# Patient Record
Sex: Female | Born: 1965 | Race: Black or African American | Hispanic: No | Marital: Married | Smoking: Never smoker
Health system: Southern US, Community
[De-identification: ages and names within clinical notes are randomized; demographics above are authoritative.]

## PROBLEM LIST (undated history)

## (undated) DIAGNOSIS — I10 Essential (primary) hypertension: Secondary | ICD-10-CM

## (undated) DIAGNOSIS — R7303 Prediabetes: Secondary | ICD-10-CM

## (undated) DIAGNOSIS — D649 Anemia, unspecified: Secondary | ICD-10-CM

## (undated) HISTORY — DX: Essential (primary) hypertension: I10

## (undated) HISTORY — DX: Anemia, unspecified: D64.9

---
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
## 1996-07-11 HISTORY — PX: CHOLECYSTECTOMY: SHX55

## 1997-07-11 HISTORY — PX: UMBILICAL HERNIA REPAIR: SHX196

## 2003-06-19 ENCOUNTER — Ambulatory Visit (HOSPITAL_COMMUNITY): Admission: RE | Admit: 2003-06-19 | Discharge: 2003-06-19 | Payer: Self-pay | Admitting: *Deleted

## 2003-08-22 ENCOUNTER — Ambulatory Visit (HOSPITAL_COMMUNITY): Admission: RE | Admit: 2003-08-22 | Discharge: 2003-08-22 | Payer: Self-pay | Admitting: *Deleted

## 2003-10-14 ENCOUNTER — Ambulatory Visit (HOSPITAL_COMMUNITY): Admission: RE | Admit: 2003-10-14 | Discharge: 2003-10-14 | Payer: Self-pay | Admitting: *Deleted

## 2003-10-21 ENCOUNTER — Ambulatory Visit (HOSPITAL_COMMUNITY): Admission: RE | Admit: 2003-10-21 | Discharge: 2003-10-21 | Payer: Self-pay | Admitting: *Deleted

## 2003-11-14 ENCOUNTER — Inpatient Hospital Stay (HOSPITAL_COMMUNITY): Admission: AD | Admit: 2003-11-14 | Discharge: 2003-11-14 | Payer: Self-pay | Admitting: Family Medicine

## 2003-11-18 ENCOUNTER — Inpatient Hospital Stay (HOSPITAL_COMMUNITY): Admission: RE | Admit: 2003-11-18 | Discharge: 2003-11-21 | Payer: Self-pay | Admitting: *Deleted

## 2003-11-24 ENCOUNTER — Inpatient Hospital Stay (HOSPITAL_COMMUNITY): Admission: AD | Admit: 2003-11-24 | Discharge: 2003-11-24 | Payer: Self-pay | Admitting: Obstetrics and Gynecology

## 2004-09-30 ENCOUNTER — Other Ambulatory Visit: Admission: RE | Admit: 2004-09-30 | Discharge: 2004-09-30 | Payer: Self-pay | Admitting: Obstetrics and Gynecology

## 2005-03-25 IMAGING — US US FETAL BPP W/O NONSTRESS
1 series · 14 of 28 positions shown · non-contrast
Comparison: none

CLINICAL DATA: Assess amniotic fluid volume and biophysical profile.  G2 P1.

[Series 1: unknown · 14 of 41 slices shown]
[im 2/41]
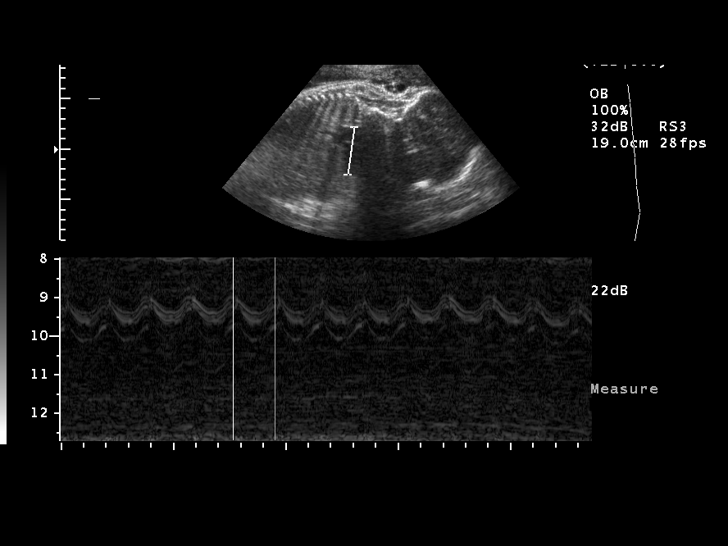
[im 5/41]
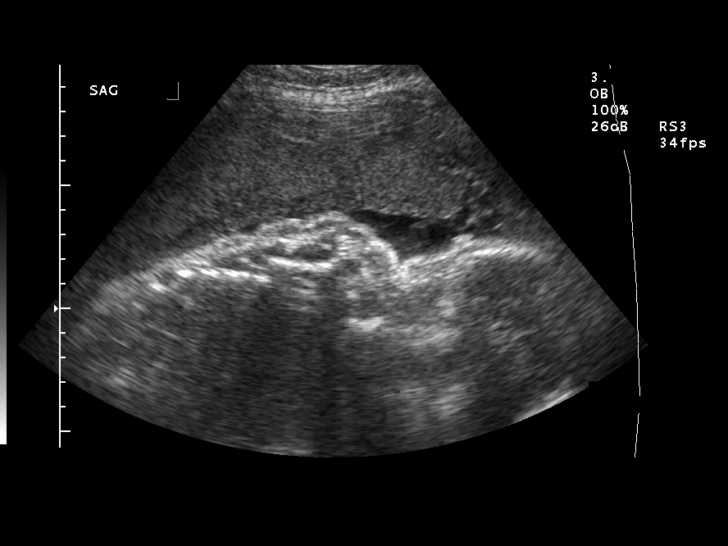
[im 8/41]
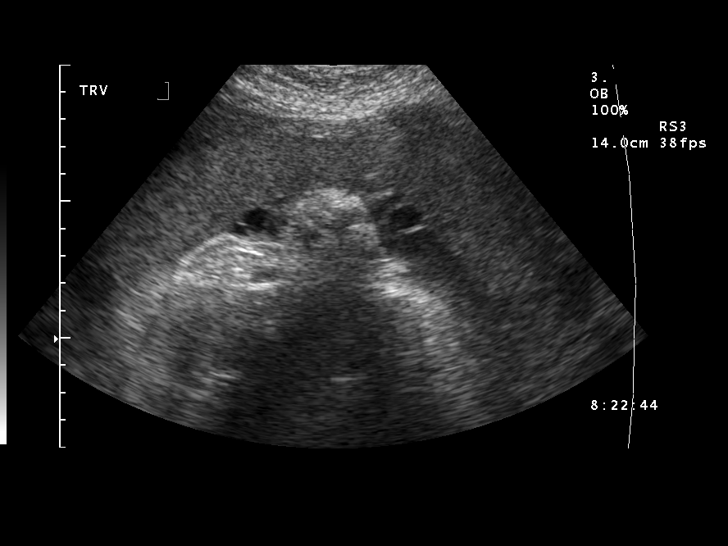
[im 11/41]
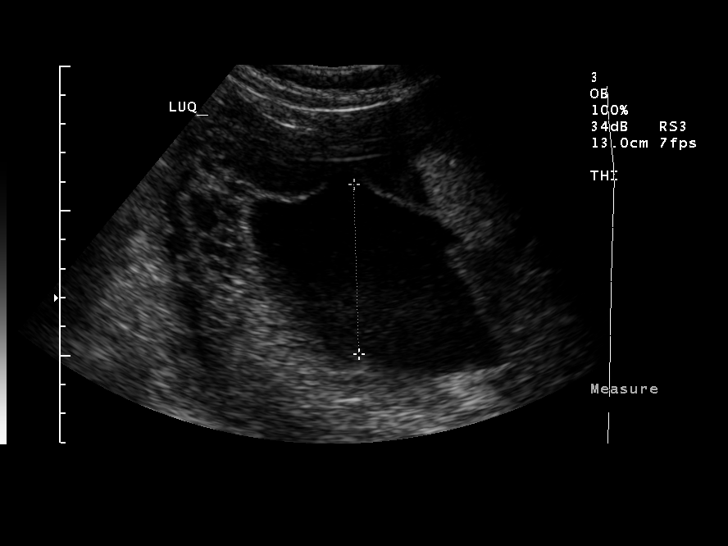
[im 14/41]
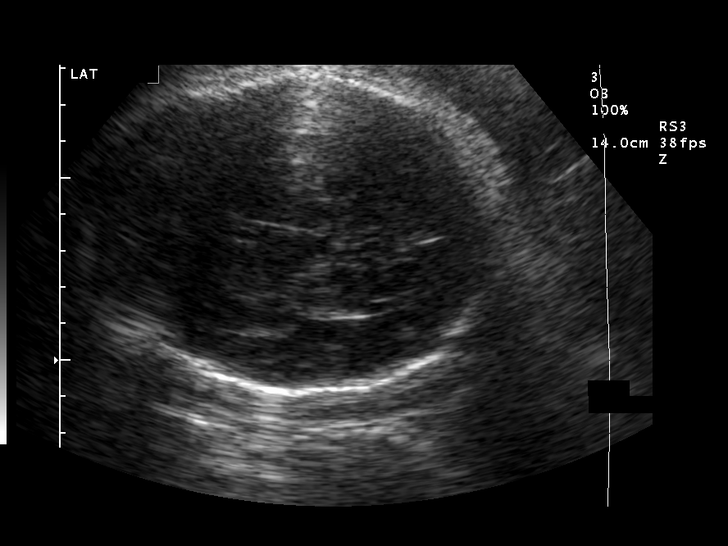
[im 17/41]
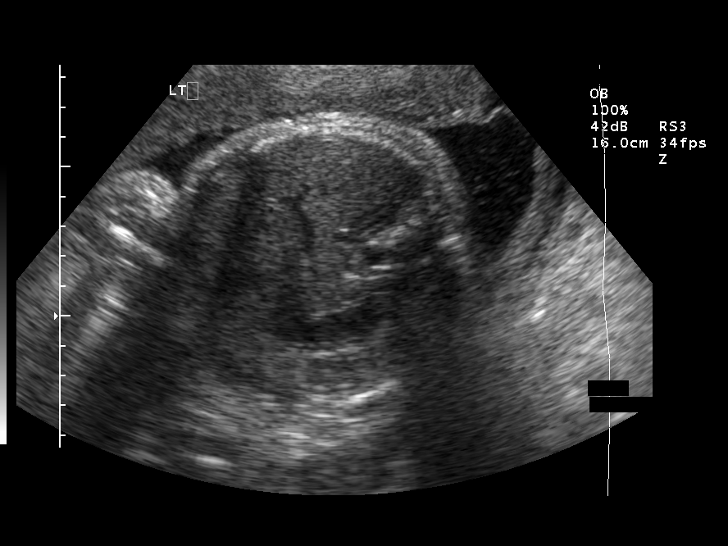
[im 20/41]
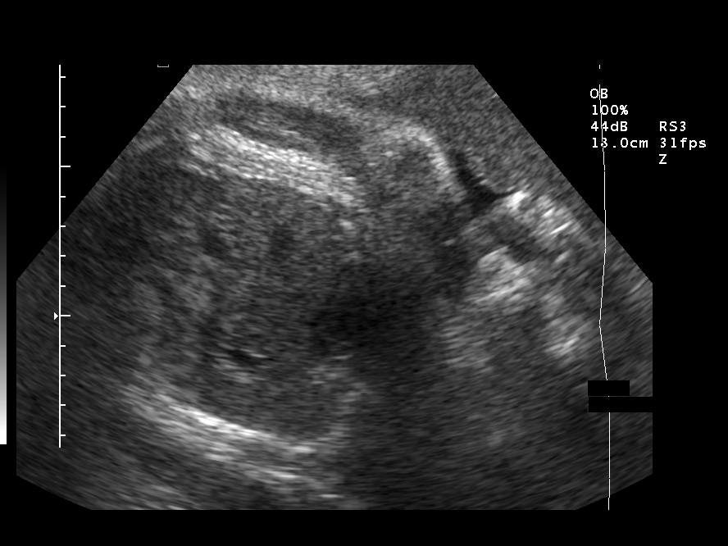
[im 23/41]
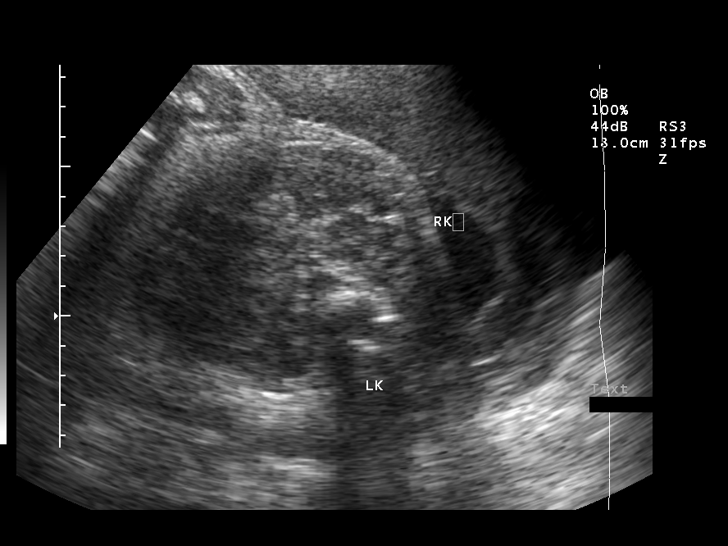
[im 26/41]
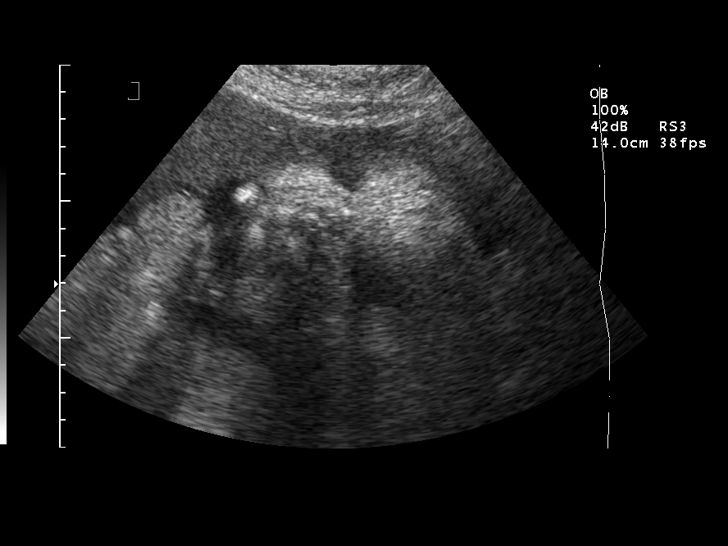
[im 29/41]
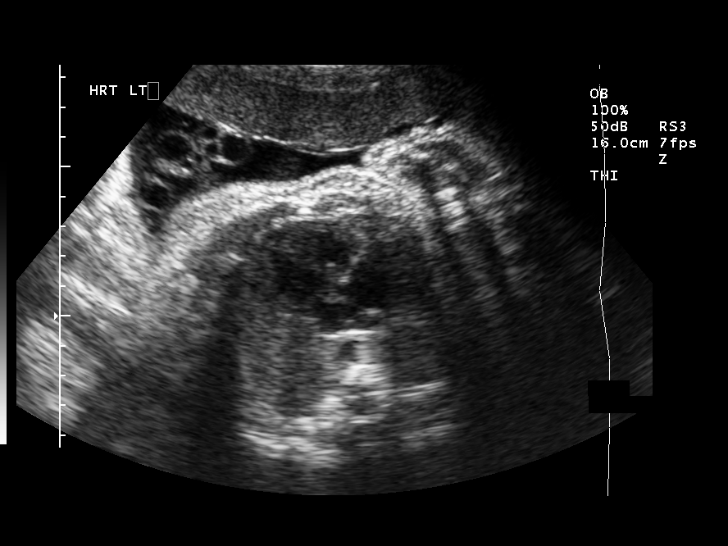
[im 32/41]
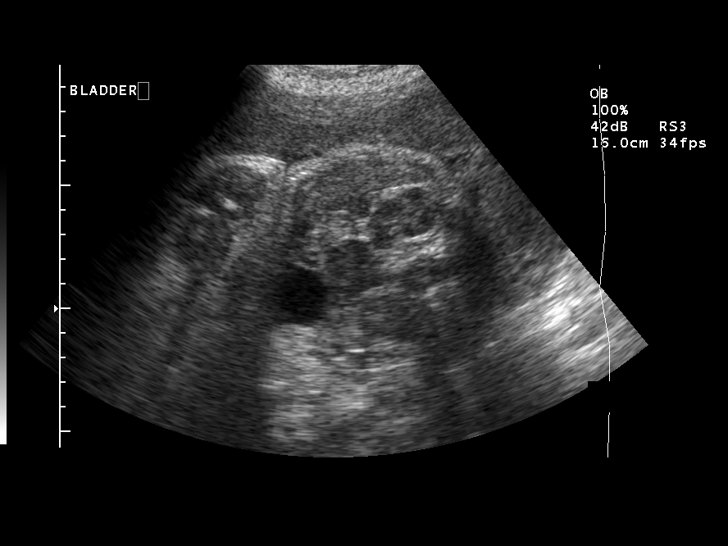
[im 35/41]
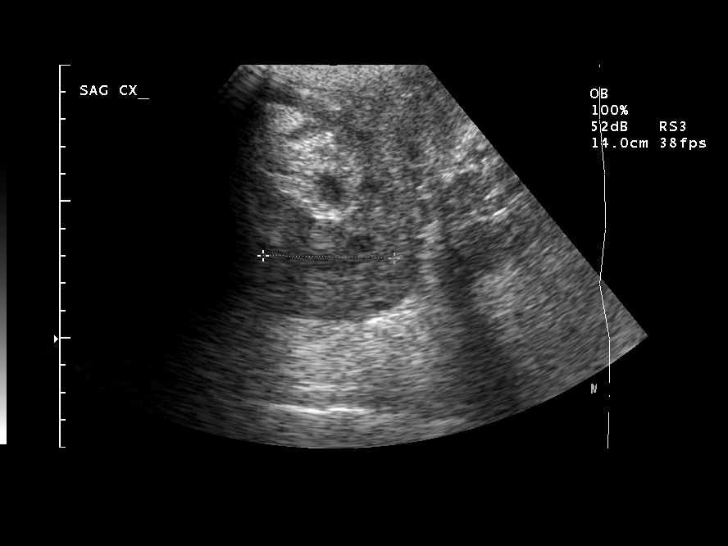
[im 38/41]
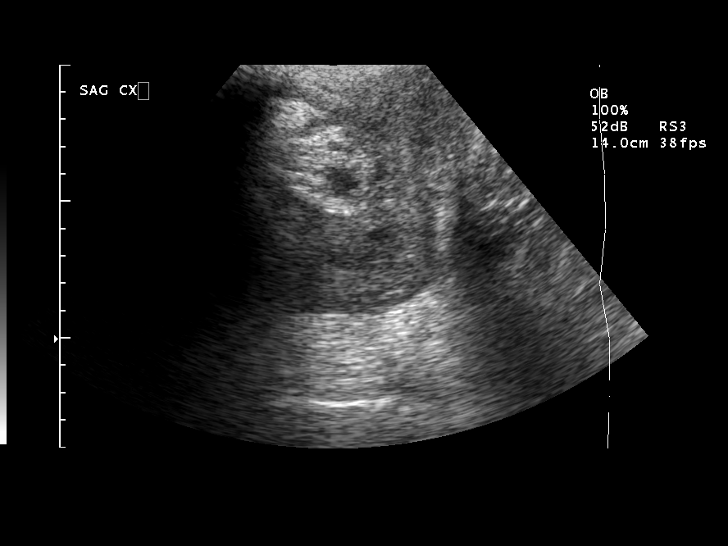
[im 41/41]
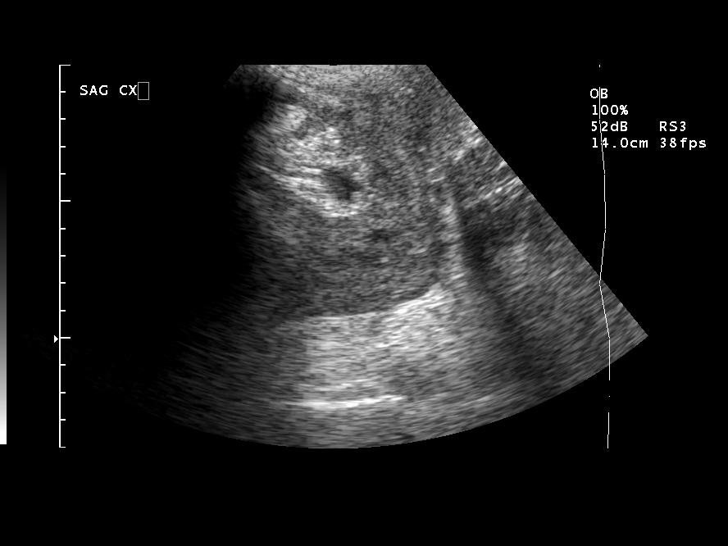

[14 of 28 positions shown; findings below may reference images not displayed]

LIMITED OBSTETRICAL ULTRASOUND
 Number of Fetuses:  1
 Heart Rate:  162
 Movement:  Yes
 Breathing:  Yes
 Presentation:  Cephalic
 Placental Location:  Anterior
 Grade:  II
 Previa:  No
 Amniotic Fluid (Subjective):  Normal
 Amniotic Fluid (Objective):  17.3 cm AFI (5th -95th%ile =   7.7 ? 24.9 cm for 36 wks)

 Fetal measurements and complete anatomic evaluation were not requested.  The following fetal anatomy was visualized during this exam:  Lateral ventricles, stomach, kidneys, bladder and diaphragm

 MATERNAL FINDINGS
 Cervix:  4.6 cm Translabially
IMPRESSION: Single living intrauterine fetus in cephalic presentation.  Normal amniotic fluid volume with AFI 17.3 cm.
 BIOPHYSICAL PROFILE

 Movement:  2    Time:  30 minutes
 Breathing:  2
 Tone:  2
 Amniotic Fluid:  2

 Total Score:  8
IMPRESSION: Biophysical profile score is [DATE] in 30 minutes of scanning.

## 2006-03-24 ENCOUNTER — Inpatient Hospital Stay (HOSPITAL_COMMUNITY): Admission: RE | Admit: 2006-03-24 | Discharge: 2006-03-27 | Payer: Self-pay | Admitting: Obstetrics

## 2006-03-24 ENCOUNTER — Encounter (INDEPENDENT_AMBULATORY_CARE_PROVIDER_SITE_OTHER): Payer: Self-pay | Admitting: Specialist

## 2006-04-07 ENCOUNTER — Inpatient Hospital Stay (HOSPITAL_COMMUNITY): Admission: AD | Admit: 2006-04-07 | Discharge: 2006-04-07 | Payer: Self-pay | Admitting: Obstetrics

## 2006-11-28 ENCOUNTER — Emergency Department (HOSPITAL_COMMUNITY): Admission: EM | Admit: 2006-11-28 | Discharge: 2006-11-29 | Payer: Self-pay | Admitting: Emergency Medicine

## 2008-07-21 ENCOUNTER — Ambulatory Visit (HOSPITAL_COMMUNITY): Admission: RE | Admit: 2008-07-21 | Discharge: 2008-07-21 | Payer: Self-pay | Admitting: Obstetrics

## 2010-11-26 NOTE — Op Note (Signed)
[name], [name]                            ACCOUNT NO.:  [id number]   MEDICAL RECORD NO.:  [id number]                   PATIENT TYPE:  INP   LOCATION:  9119                                 FACILITY:  [facility]   PHYSICIAN:  Conni Elliot, M.D.             DATE OF BIRTH:  1965/11/14   DATE OF PROCEDURE:  11/18/2003  DATE OF DISCHARGE:                                 OPERATIVE REPORT   PREOPERATIVE DIAGNOSES:  Desire for repeat cesarean delivery.   POSTOPERATIVE DIAGNOSES:  Desire for repeat cesarean delivery.   OPERATION:  Low transverse cesarean section.   SURGEON:  Conni Elliot, M.D.   FINDINGS:  Female infant with Apgars of 9 and 9, cord pH was sent, placenta  was not sent.   DESCRIPTION OF PROCEDURE:  After placing the patient under spinal  anesthetic, the patient supine, __________  abdomen was prepped and draped  in a sterile fashion.  Low transverse Pfannenstiel incision was made.  Incision was made in the skin and fascia, rectus muscles opened in line.  Peritoneal cavity, bladder flap, uterine incision was made.  The infant was  in vertex presentation, cord double clamped and cut, infant handed off to  pediatrician in attendance.  Was a loose nuchal cord.  The placenta was  delivered spontaneously, uterus, bladder flap, anterior peritoneum, fascia  and subcutaneous tissue were closed in usual fashion. Estimated blood loss  approximately [phone]  mL without replacement.                                               Conni Elliot, M.D.    ASG/MEDQ  D:  11/18/2003  T:  11/19/2003  Job:  [id number]

## 2010-11-26 NOTE — Discharge Summary (Signed)
Carla [name], Carla [name]                            ACCOUNT NO.:  [id number]   MEDICAL RECORD NO.:  [id number]                   PATIENT TYPE:  INP   LOCATION:  9119                                 FACILITY:  [facility]   PHYSICIAN:  Conni Elliot, M.D.             DATE OF BIRTH:  [birth year]   DATE OF ADMISSION:  11/18/2003  DATE OF DISCHARGE:  11/21/2003                                 DISCHARGE SUMMARY   HISTORY OF PRESENT ILLNESS:  The patient is a 45 year old patient with prior  cesarean delivery and requesting a repeat.  The patient is at term.  The  patient was admitted for repeat low transverse cesarean section.   HOSPITAL COURSE:  The patient had a female infant with Apgars of 9 and 9,  weighed 8 pounds 1 ounce. The patient had a normal postoperative course and  had regained normal bowel and bladder function and was thought to be ready  for discharge on Nov 21, 2003.                                               Conni Elliot, M.D.    ASG/MEDQ  D:  03/16/2004  T:  03/16/2004  Job:  [id number]

## 2010-11-26 NOTE — Discharge Summary (Signed)
NAMECHRYSA, RAMPY                ACCOUNT NO.:  [id number]   MEDICAL RECORD NO.:  [id number]          PATIENT TYPE:  INP   LOCATION:  9117                          FACILITY:  [facility]   PHYSICIAN:  Kathreen Cosier, M.D.DATE OF BIRTH:  07/29/1965   DATE OF ADMISSION:  03/24/2006  DATE OF DISCHARGE:  03/27/2006                                 DISCHARGE SUMMARY   The patient is a 45 year old gravida 3, para 2-0-02, estimated date of  confinement April 01, 2006.  She had two previous cesarean sections and  she __________ for repeat cesarean section and tubal ligation.  She has  positive GBS.  The patient underwent  a repeat low transverse cesarean  section and tubal ligation.  She had a 9 pound 5 ounce female Apgar 9/9 from  the OP position.  Postoperatively she did well.  On admission her hemoglobin  was 10.5, postoperative 8.3, platelet count 155 and 141.  PT, PTT, INR  normal.  Sodium 134, potassium 3.7, chloride 104.  Remainder of labs on the  prenatal record.  Patient was discharged on the third postoperative day on  Tylox for pain and ferrous sulfate for anemia.           ______________________________  Kathreen Cosier, M.D.     BAM/MEDQ  D:  04/26/2006  T:  04/27/2006  Job:  [id number]

## 2011-03-04 ENCOUNTER — Other Ambulatory Visit (HOSPITAL_COMMUNITY): Payer: Self-pay | Admitting: Obstetrics

## 2011-03-04 DIAGNOSIS — Z1231 Encounter for screening mammogram for malignant neoplasm of breast: Secondary | ICD-10-CM

## 2011-03-23 ENCOUNTER — Ambulatory Visit (HOSPITAL_COMMUNITY)
Admission: RE | Admit: 2011-03-23 | Discharge: 2011-03-23 | Disposition: A | Discharge status: Home / Self Care | Payer: BC Managed Care – PPO | Source: Ambulatory Visit | Attending: Obstetrics | Admitting: Obstetrics

## 2011-03-23 DIAGNOSIS — Z1231 Encounter for screening mammogram for malignant neoplasm of breast: Secondary | ICD-10-CM

## 2011-03-28 ENCOUNTER — Other Ambulatory Visit: Payer: Self-pay | Admitting: Obstetrics

## 2011-03-28 DIAGNOSIS — R928 Other abnormal and inconclusive findings on diagnostic imaging of breast: Secondary | ICD-10-CM

## 2011-04-06 ENCOUNTER — Ambulatory Visit
Admission: RE | Admit: 2011-04-06 | Discharge: 2011-04-06 | Disposition: A | Discharge status: Home / Self Care | Payer: BC Managed Care – PPO | Source: Ambulatory Visit | Attending: Obstetrics | Admitting: Obstetrics

## 2011-04-06 DIAGNOSIS — R928 Other abnormal and inconclusive findings on diagnostic imaging of breast: Secondary | ICD-10-CM

## 2012-03-19 ENCOUNTER — Other Ambulatory Visit (HOSPITAL_COMMUNITY): Payer: Self-pay | Admitting: Family Medicine

## 2012-03-19 DIAGNOSIS — Z1231 Encounter for screening mammogram for malignant neoplasm of breast: Secondary | ICD-10-CM

## 2012-04-02 ENCOUNTER — Ambulatory Visit (HOSPITAL_COMMUNITY)
Admission: RE | Admit: 2012-04-02 | Discharge: 2012-04-02 | Disposition: A | Discharge status: Home / Self Care | Payer: BC Managed Care – PPO | Source: Ambulatory Visit | Attending: Family Medicine | Admitting: Family Medicine

## 2012-04-02 DIAGNOSIS — Z1231 Encounter for screening mammogram for malignant neoplasm of breast: Secondary | ICD-10-CM

## 2013-02-28 ENCOUNTER — Other Ambulatory Visit (HOSPITAL_COMMUNITY): Payer: Self-pay | Admitting: Obstetrics

## 2013-02-28 DIAGNOSIS — Z1231 Encounter for screening mammogram for malignant neoplasm of breast: Secondary | ICD-10-CM

## 2013-04-03 ENCOUNTER — Ambulatory Visit (HOSPITAL_COMMUNITY)
Admission: RE | Admit: 2013-04-03 | Discharge: 2013-04-03 | Disposition: A | Discharge status: Home / Self Care | Payer: BC Managed Care – PPO | Source: Ambulatory Visit | Attending: Obstetrics | Admitting: Obstetrics

## 2013-04-03 DIAGNOSIS — Z1231 Encounter for screening mammogram for malignant neoplasm of breast: Secondary | ICD-10-CM | POA: Insufficient documentation

## 2013-08-28 ENCOUNTER — Ambulatory Visit (INDEPENDENT_AMBULATORY_CARE_PROVIDER_SITE_OTHER): Payer: BC Managed Care – PPO | Admitting: Family Medicine

## 2013-08-28 ENCOUNTER — Ambulatory Visit: Payer: BC Managed Care – PPO

## 2013-08-28 VITALS — BP 136/90 | HR 66 | Temp 98.6°F | Ht 62.0 in | Wt 161.4 lb

## 2013-08-28 DIAGNOSIS — I10 Essential (primary) hypertension: Secondary | ICD-10-CM

## 2013-08-28 DIAGNOSIS — M25511 Pain in right shoulder: Secondary | ICD-10-CM

## 2013-08-28 DIAGNOSIS — M25519 Pain in unspecified shoulder: Secondary | ICD-10-CM

## 2013-08-28 MED ORDER — MELOXICAM 15 MG PO TABS: ORAL_TABLET | ORAL | Status: DC

## 2013-08-28 NOTE — Patient Instructions (Addendum)
Thank you for coming in today  Xrays are negative for fracture Wear sling for the next week Return in 1 week next Wednesday night after 5pm to see Dr. Neomia DearVoss for recheck Take meloxicam daily with breakfast for the next week No use of right arm at work  Shoulder Pain The shoulder is the joint that connects your arms to your body. The bones that form the shoulder joint include the upper arm bone (humerus), the shoulder blade (scapula), and the collarbone (clavicle). The top of the humerus is shaped like a ball and fits into a rather flat socket on the scapula (glenoid cavity). A combination of muscles and strong, fibrous tissues that connect muscles to bones (tendons) support your shoulder joint and hold the ball in the socket. Small, fluid-filled sacs (bursae) are located in different areas of the joint. They act as cushions between the bones and the overlying soft tissues and help reduce friction between the gliding tendons and the bone as you move your arm. Your shoulder joint allows a wide range of motion in your arm. This range of motion allows you to do things like scratch your back or throw a ball. However, this range of motion also makes your shoulder more prone to pain from overuse and injury. Causes of shoulder pain can originate from both injury and overuse and usually can be grouped in the following four categories:  Redness, swelling, and pain (inflammation) of the tendon (tendinitis) or the bursae (bursitis).  Instability, such as a dislocation of the joint.  Inflammation of the joint (arthritis).  Broken bone (fracture). HOME CARE INSTRUCTIONS   Apply ice to the sore area.  Put ice in a plastic bag.  Place a towel between your skin and the bag.  Leave the ice on for 15-20 minutes, 03-04 times per day for the first 2 days.  Stop using cold packs if they do not help with the pain.  If you have a shoulder sling or immobilizer, wear it as long as your caregiver instructs. Only  remove it to shower or bathe. Move your arm as little as possible, but keep your hand moving to prevent swelling.  Squeeze a soft ball or foam pad as much as possible to help prevent swelling.  Only take over-the-counter or prescription medicines for pain, discomfort, or fever as directed by your caregiver. SEEK MEDICAL CARE IF:   Your shoulder pain increases, or new pain develops in your arm, hand, or fingers.  Your hand or fingers become cold and numb.  Your pain is not relieved with medicines. SEEK IMMEDIATE MEDICAL CARE IF:   Your arm, hand, or fingers are numb or tingling.  Your arm, hand, or fingers are significantly swollen or turn white or blue. MAKE SURE YOU:   Understand these instructions.  Will watch your condition.  Will get help right away if you are not doing well or get worse. Document Released: 04/06/2005 Document Revised: 03/21/2012 Document Reviewed: 06/11/2011 [facility]Exit[facility] Patient Information 2014 [place], [place].

## 2013-08-28 NOTE — Progress Notes (Addendum)
   Subjective:    Patient ID: Carla [name], female    DOB: [birth year], 48 y.o.   MRN: [id number]  Shoulder Pain    Patient feel and slipped on some ice and landed on right shoulder this afternoon. Cannot lift or stretch arm after injury. Current pain is such that she cannot even get her jacket off. No previous shoulder injury or surgery. Did not take any meds yet. No numbness or tingling. No weakness.   Review of Systems  All other systems reviewed and are negative.      Objective:   Physical Exam  Constitutional: She is oriented to person, place, and time. She appears well-developed and well-nourished. She appears distressed.  HENT:  Head: Normocephalic and atraumatic.  Eyes: Conjunctivae are normal. No scleral icterus.  Cardiovascular: Normal rate.   Pulmonary/Chest: Effort normal.  Musculoskeletal:       Right shoulder: She exhibits decreased range of motion, tenderness, bony tenderness, pain and decreased strength. She exhibits no swelling.  Neurological: She is alert and oriented to person, place, and time.  Skin: Skin is warm. She is diaphoretic.  Psychiatric: She has a normal mood and affect. Her behavior is normal.   [facility] reading (PRIMARY) by  Dr. Neomia DearVoss. Negative for fracture or dislocation.    Assessment & Plan:  #1. 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 Coracoid fracture. This is not present on any other view. Unable to differentiate clinically as patient is in too much generalized pain

## 2013-09-04 ENCOUNTER — Ambulatory Visit (INDEPENDENT_AMBULATORY_CARE_PROVIDER_SITE_OTHER): Payer: BC Managed Care – PPO | Admitting: Family Medicine

## 2013-09-04 VITALS — BP 136/80 | HR 67 | Temp 98.0°F | Resp 16 | Ht 61.75 in | Wt 164.2 lb

## 2013-09-04 DIAGNOSIS — M25519 Pain in unspecified shoulder: Secondary | ICD-10-CM

## 2013-09-04 DIAGNOSIS — M25511 Pain in right shoulder: Secondary | ICD-10-CM

## 2013-09-04 MED ORDER — TRAMADOL HCL 50 MG PO TABS: 50.0000 mg | ORAL_TABLET | Freq: Two times a day (BID) | ORAL | Status: DC | PRN

## 2013-09-04 MED ORDER — IBUPROFEN 600 MG PO TABS: 600.0000 mg | ORAL_TABLET | Freq: Three times a day (TID) | ORAL | Status: DC | PRN

## 2013-09-04 NOTE — Patient Instructions (Signed)
Thank you for coming in today  1. Stop meloxicam 2. Start ibuprofen 3x per day 3. Take tramadol up to 2x per day for pain 4. Continue sling 5. Continue ice and heat 6. Followup next Weds after 5pm with Dr. Neomia DearVoss  We will order an MRI next week if you are not making significant improvement.

## 2013-09-04 NOTE — Progress Notes (Signed)
   Subjective:    Patient ID: Carla CobbsIsatu [name], female    DOB: [birth year], 48 y.o.   MRN: [id number]  HPI Followup of right shoulder pain after fall. Still very painful, but is a little better. Only about 10% better. Mobic in the morning helps but pain returns in the afternoon. Pain with reaching or grabbing something. Having difficulty sleeping at night. Also using ice and sling. Has been off work for one week.  Review of Systems  All other systems reviewed and are negative.       Objective:   Physical Exam  Constitutional: She is oriented to person, place, and time. She appears well-developed and well-nourished. No distress.  HENT:  Head: Normocephalic and atraumatic.  Eyes: Conjunctivae are normal. No scleral icterus.  Neck: Normal range of motion. Neck supple.  Musculoskeletal:       Right shoulder: She exhibits decreased range of motion, tenderness and decreased strength. She exhibits no swelling and no deformity.  Neurological: She is alert and oriented to person, place, and time.  Skin: Skin is warm and dry. She is not diaphoretic.  Psychiatric: She has a normal mood and affect. Her behavior is normal.  Right shoulder: Severe pain with passive and active ROM. Passive ROM limited to 90 FF and 70-80 abd. ER normal without pain. Pain with [place]. Unable to perform empty can. Belly press wnl. Strength 5/5 with pain on resisted ER.    Assessment & Plan:  #1. Right shoulder pain - minimal improvement from last week - DC mobic, start iburprofen - tramadol bid prn - Continue sling - F/u 1 week. If still no improvement will order MRI. - Work note

## 2013-09-11 ENCOUNTER — Ambulatory Visit (INDEPENDENT_AMBULATORY_CARE_PROVIDER_SITE_OTHER): Payer: BC Managed Care – PPO | Admitting: Family Medicine

## 2013-09-11 VITALS — BP 110/70 | HR 71 | Temp 98.3°F | Resp 16 | Ht 61.75 in | Wt 162.6 lb

## 2013-09-11 DIAGNOSIS — M25519 Pain in unspecified shoulder: Secondary | ICD-10-CM

## 2013-09-11 DIAGNOSIS — Z0271 Encounter for disability determination: Secondary | ICD-10-CM

## 2013-09-11 MED ORDER — TRAMADOL HCL 50 MG PO TABS: 50.0000 mg | ORAL_TABLET | Freq: Two times a day (BID) | ORAL | Status: DC | PRN

## 2013-09-11 NOTE — Patient Instructions (Signed)
Thank you for coming in today  1. Start shoulder stretches. Do these at least 3 times per day 2. Refill tramadol 3. Work note for 2 weeks 4. We will complete FMLA paperwork for you. 5. Followup with Dr. Neomia DearVoss in 2 weeks on Wednesday night, 09/25/13

## 2013-09-11 NOTE — Progress Notes (Signed)
   Subjective:    Patient ID: Carla [name], female    DOB: 12-20-1965, 48 y.o.   MRN: [id number]  HPI Patient presents for f/u of right shoulder injury suffered 2 weeks ago during slip and fall. Fortunately, she is doing much better now. Her pain is significantly improved. 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 Taking ibuprofen and tramadol, which are helpful. Needs refill of tramadol.  Review of Systems  All other systems reviewed and are negative.      Objective:   Physical Exam  Constitutional: She appears well-developed and well-nourished. No distress.  HENT:  Head: Normocephalic and atraumatic.  Cardiovascular: Normal rate.   Pulmonary/Chest: Effort normal.  Right Shoulder: Active ROM to 80 degrees FF and 70 degrees Abd. ER to 45 deg. IR to L4. Passively I can get her to 100 FF, 80 abd, 60 ER. Strength 5/5 with ER, 4/5 with empty can. Exam significantly improved from last week     Assessment & Plan:  #1. Right shoulder strain and contusion - Improved dramatically this week - Still with some stiffness - will start ROM stretching exercises for stiff shoulder - FMLA paperwork - Refill tramadol - Work note - F/u 2 weeks with Dr. Neomia DearVoss

## 2013-09-16 ENCOUNTER — Encounter: Payer: Self-pay | Admitting: Family Medicine

## 2013-09-16 ENCOUNTER — Telehealth: Payer: Self-pay

## 2013-09-16 NOTE — Telephone Encounter (Signed)
PTS FMLA PPW WAS GIVEN TO DR.SMITH ON 09/12/13, AND IS CURRENTLY AWAITING COMPLETION DUE TO THE ABSENCE OF DR.VOSS.

## 2013-09-25 ENCOUNTER — Ambulatory Visit (INDEPENDENT_AMBULATORY_CARE_PROVIDER_SITE_OTHER): Payer: BC Managed Care – PPO | Admitting: Family Medicine

## 2013-09-25 VITALS — BP 110/76 | HR 66 | Temp 98.4°F | Resp 16 | Ht 62.0 in | Wt 163.4 lb

## 2013-09-25 DIAGNOSIS — M25519 Pain in unspecified shoulder: Secondary | ICD-10-CM

## 2013-09-25 DIAGNOSIS — M25511 Pain in right shoulder: Secondary | ICD-10-CM

## 2013-09-25 DIAGNOSIS — S46019A Strain of muscle(s) and tendon(s) of the rotator cuff of unspecified shoulder, initial encounter: Secondary | ICD-10-CM

## 2013-09-25 DIAGNOSIS — S43429A Sprain of unspecified rotator cuff capsule, initial encounter: Secondary | ICD-10-CM

## 2013-09-25 MED ORDER — TRAMADOL HCL 50 MG PO TABS: 50.0000 mg | ORAL_TABLET | Freq: Two times a day (BID) | ORAL | Status: DC | PRN

## 2013-09-25 MED ORDER — IBUPROFEN 600 MG PO TABS: 600.0000 mg | ORAL_TABLET | Freq: Three times a day (TID) | ORAL | Status: DC | PRN

## 2013-09-25 NOTE — Progress Notes (Signed)
Patient discussed and examined with Dr. Neomia DearVoss. Agree with assessment and plan of care per her note.

## 2013-09-25 NOTE — Progress Notes (Signed)
   Subjective:    Patient ID: Carla [name], female    DOB: Dec 28, 1965, 48 y.o.   MRN: [id number]  HPI Patient presents for followup of right shoulder injury 4 weeks ago. Still improving but difficulty with shoulder. Still decr ROM. On tramadol and ibuprofen. Out of work.  Review of Systems  All other systems reviewed and are negative.       Objective:   Physical Exam  Constitutional: She appears well-developed and well-nourished.  HENT:  Head: Normocephalic and atraumatic.  Musculoskeletal:       Right shoulder: She exhibits decreased range of motion, tenderness, crepitus, pain and decreased strength.  Difficult exam due to pain and guarding. Decr ROM to 90 deg FF passively and 60 abd passively with clunk and crepitance. Significant guarding. 5/5 strength in ER. Unable to perform empty can due to pain. Normal ER passively.    Assessment & Plan:  #1. Right shoulder pain - Concern for rotator cuff vs. Labrum vs. Frozen shoulder. Cannot r/o RTC tear due to guarding.  - Lidocaine injection 1%, 5 cc for reexam and diagnosis  - After lidocaine injection, reexamined. FPROM. Decr pain by 75%. Neg drop arm. 5/5 strength ER. 4+/5 empty can with continued pain. Low suspicion for full thickness RTC tear. Will continue conservative therapy  - Refill meds - OOW x 4 weeks - Refer PT - Declined cortisone shot  Procedure: Consent obtained. Risks and benefits discussed. Posterior shoulder marked and prepped with betadine and alcohol swab. Skin anesthetized with ethyl chloride spray. Subacromial space injected with 5 cc of 1% lidocaine. Tolerated well. No complications.

## 2013-09-25 NOTE — Patient Instructions (Signed)
Thank you for coming in today  1. Continue out of work 2. Continue ibuprofen and tramadol 3. Refer to physical therapy  Followup 4 weeks.  Shoulder Pain The shoulder is the joint that connects your arms to your body. The bones that form the shoulder joint include the upper arm bone (humerus), the shoulder blade (scapula), and the collarbone (clavicle). The top of the humerus is shaped like a ball and fits into a rather flat socket on the scapula (glenoid cavity). A combination of muscles and strong, fibrous tissues that connect muscles to bones (tendons) support your shoulder joint and hold the ball in the socket. Small, fluid-filled sacs (bursae) are located in different areas of the joint. They act as cushions between the bones and the overlying soft tissues and help reduce friction between the gliding tendons and the bone as you move your arm. Your shoulder joint allows a wide range of motion in your arm. This range of motion allows you to do things like scratch your back or throw a ball. However, this range of motion also makes your shoulder more prone to pain from overuse and injury. Causes of shoulder pain can originate from both injury and overuse and usually can be grouped in the following four categories:  Redness, swelling, and pain (inflammation) of the tendon (tendinitis) or the bursae (bursitis).  Instability, such as a dislocation of the joint.  Inflammation of the joint (arthritis).  Broken bone (fracture). HOME CARE INSTRUCTIONS   Apply ice to the sore area.  Put ice in a plastic bag.  Place a towel between your skin and the bag.  Leave the ice on for 15-20 minutes, 03-04 times per day for the first 2 days.  Stop using cold packs if they do not help with the pain.  If you have a shoulder sling or immobilizer, wear it as long as your caregiver instructs. Only remove it to shower or bathe. Move your arm as little as possible, but keep your hand moving to prevent  swelling.  Squeeze a soft ball or foam pad as much as possible to help prevent swelling.  Only take over-the-counter or prescription medicines for pain, discomfort, or fever as directed by your caregiver. SEEK MEDICAL CARE IF:   Your shoulder pain increases, or new pain develops in your arm, hand, or fingers.  Your hand or fingers become cold and numb.  Your pain is not relieved with medicines. SEEK IMMEDIATE MEDICAL CARE IF:   Your arm, hand, or fingers are numb or tingling.  Your arm, hand, or fingers are significantly swollen or turn white or blue. MAKE SURE YOU:   Understand these instructions.  Will watch your condition.  Will get help right away if you are not doing well or get worse. Document Released: 04/06/2005 Document Revised: 03/21/2012 Document Reviewed: 06/11/2011 [facility] Patient Information 2014 [place]Care, [place]LL[place].

## 2013-10-07 ENCOUNTER — Ambulatory Visit: Payer: BC Managed Care – PPO | Attending: Family Medicine | Admitting: Physical Therapy

## 2013-10-07 DIAGNOSIS — M25519 Pain in unspecified shoulder: Secondary | ICD-10-CM | POA: Insufficient documentation

## 2013-10-07 DIAGNOSIS — IMO0001 Reserved for inherently not codable concepts without codable children: Secondary | ICD-10-CM | POA: Insufficient documentation

## 2013-10-07 DIAGNOSIS — R293 Abnormal posture: Secondary | ICD-10-CM | POA: Insufficient documentation

## 2013-10-07 DIAGNOSIS — M25619 Stiffness of unspecified shoulder, not elsewhere classified: Secondary | ICD-10-CM | POA: Insufficient documentation

## 2013-10-07 DIAGNOSIS — R5381 Other malaise: Secondary | ICD-10-CM | POA: Insufficient documentation

## 2013-10-08 ENCOUNTER — Ambulatory Visit: Payer: BC Managed Care – PPO | Admitting: Physical Therapy

## 2013-10-14 ENCOUNTER — Telehealth: Payer: Self-pay | Admitting: Family Medicine

## 2013-10-14 ENCOUNTER — Ambulatory Visit: Payer: BC Managed Care – PPO | Attending: Family Medicine | Admitting: Physical Therapy

## 2013-10-14 DIAGNOSIS — R293 Abnormal posture: Secondary | ICD-10-CM | POA: Insufficient documentation

## 2013-10-14 DIAGNOSIS — M25619 Stiffness of unspecified shoulder, not elsewhere classified: Secondary | ICD-10-CM | POA: Insufficient documentation

## 2013-10-14 DIAGNOSIS — R5381 Other malaise: Secondary | ICD-10-CM | POA: Insufficient documentation

## 2013-10-14 DIAGNOSIS — M25519 Pain in unspecified shoulder: Secondary | ICD-10-CM | POA: Insufficient documentation

## 2013-10-14 DIAGNOSIS — IMO0001 Reserved for inherently not codable concepts without codable children: Secondary | ICD-10-CM | POA: Insufficient documentation

## 2013-10-14 NOTE — Telephone Encounter (Signed)
Patient brought in disability form on 10/11/2013 and placed in Dr. Paralee CancelGreene's box on 10/14/2013. Will notify patient when ready to pick up.

## 2013-10-17 ENCOUNTER — Ambulatory Visit: Payer: BC Managed Care – PPO | Admitting: Physical Therapy

## 2013-10-17 DIAGNOSIS — IMO0001 Reserved for inherently not codable concepts without codable children: Secondary | ICD-10-CM | POA: Diagnosis not present

## 2013-10-21 ENCOUNTER — Ambulatory Visit: Payer: BC Managed Care – PPO | Admitting: Physical Therapy

## 2013-10-21 DIAGNOSIS — IMO0001 Reserved for inherently not codable concepts without codable children: Secondary | ICD-10-CM | POA: Diagnosis not present

## 2013-10-23 ENCOUNTER — Ambulatory Visit (INDEPENDENT_AMBULATORY_CARE_PROVIDER_SITE_OTHER): Payer: BC Managed Care – PPO | Admitting: Family Medicine

## 2013-10-23 VITALS — BP 110/70 | HR 63 | Temp 98.4°F | Resp 16 | Ht 61.5 in | Wt 164.0 lb

## 2013-10-23 DIAGNOSIS — IMO0002 Reserved for concepts with insufficient information to code with codable children: Secondary | ICD-10-CM

## 2013-10-23 DIAGNOSIS — S46911A Strain of unspecified muscle, fascia and tendon at shoulder and upper arm level, right arm, initial encounter: Secondary | ICD-10-CM

## 2013-10-23 NOTE — Progress Notes (Signed)
This chart was scribed for Carla SidleKurt Lauenstein, MD by Carla [name], Medical Scribe. This patient's care was started at 6:33 PM.  Patient ID: Carla [name] MRN: [id number], DOB: 1965/07/13, 48 y.o. Date of Encounter: 10/23/2013, 6:30 PM  Primary Physician: Paulino RilyJONES,ENRICO G, MD  Chief Complaint: right shoulder pain  HPI: 48 y.o. year old female with history below presents with right shoulder pain follow up. Pain worse with abduction. Was taking Tramadol for pain but now is only taking ibuprofen. States she fell and injured the right shoulder. Currently in rehabilitation and has received a shot for pain that has provided some relief. 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 Has been out of work since February 2015.  Past Medical History  Diagnosis Date   Hypertension      Home Meds: Prior to Admission medications   Medication Sig Start Date End Date Taking? Authorizing Provider  ibuprofen (ADVIL,MOTRIN) 600 MG tablet Take 1 tablet (600 mg total) by mouth 3 (three) times daily as needed. 09/25/13  Yes Carla GipErin L Voss, MD  iron polysaccharides (NIFEREX) 150 MG capsule Take 150 mg by mouth daily.   Yes Historical Provider, MD  lisinopril-hydrochlorothiazide (PRINZIDE,ZESTORETIC) 20-25 MG per tablet Take 1 tablet by mouth daily.   Yes Historical Provider, MD  traMADol (ULTRAM) 50 MG tablet Take 1 tablet (50 mg total) by mouth 2 (two) times daily as needed. 09/25/13   Carla GipErin L Voss, MD    Allergies: No Known Allergies  History   Social History   Marital Status: Married    Spouse Name: [name]    Number of Children: [name]   Years of Education: [name]   Occupational History   Not on file.   Social History Main Topics   Smoking status: Never Smoker    Smokeless tobacco: Not on file   Alcohol Use: No   Drug Use: No   Sexual Activity: Yes   Other Topics Concern   Not on file   Social History Narrative   No narrative on file     Review of Systems: Constitutional: negative for  chills, fever, night sweats, weight changes, or fatigue  HEENT: negative for vision changes, hearing loss, congestion, rhinorrhea, ST, epistaxis, or sinus pressure Cardiovascular: negative for chest pain or palpitations Respiratory: negative for hemoptysis, wheezing, shortness of breath, or cough Abdominal: negative for abdominal pain, nausea, vomiting, diarrhea, or constipation Dermatological: negative for rash Neurologic: negative for headache, dizziness, or syncope All other systems reviewed and are otherwise negative with the exception to those above and in the HPI.   Physical Exam Blood pressure 110/70, pulse 63, temperature 98.4 F (36.9 C), temperature source Oral, resp. rate 16, height 5' 1.5" (1.562 m), weight 164 lb (74.39 kg), last menstrual period 09/19/2013, SpO2 98.00%., Body mass index is 30.49 kg/(m^2). General: Well developed, well nourished, in no acute distress. Head: Normocephalic, atraumatic, eyes without discharge, sclera non-icteric, nares are without discharge. Bilateral auditory canals clear, TM's are without perforation, pearly grey and translucent with reflective cone of light bilaterally. Oral cavity moist, posterior pharynx without exudate, erythema, peritonsillar abscess, or post nasal drip.  Neck: Supple. No thyromegaly. Full ROM. No lymphadenopathy. Lungs: Clear bilaterally to auscultation without wheezes, rales, or rhonchi. Breathing is unlabored. Heart: RRR with S1 S2. No murmurs, rubs, or gallops appreciated. Abdomen: Soft, non-tender, non-distended with normoactive bowel sounds. No hepatomegaly. No rebound/guarding. No obvious abdominal masses. Msk:  Strength and tone normal for age. Right shoulder pain with abduction.  Extremities/Skin: Warm and dry.  No clubbing or cyanosis. No edema. No rashes or suspicious lesions. Neuro: Alert and oriented X 3. Moves all extremities spontaneously. Gait is normal. CNII-XII grossly in tact. Psych:  Responds to questions  appropriately with a normal affect.   Patient shows reasonable range of motion of the right shoulder. She still is unable to abduct the right shoulder beyond 80. Otherwise range of motion is normal. She has no tenderness of the right shoulder.  ASSESSMENT AND PLAN:  48 y.o. year old female with shoulder strain which is improving. She's getting physical therapy and should be rated go back to work in early May. A basket come back around May 10 or 11th.   Signed, Carla SidleKurt Lauenstein, MD 10/23/2013 6:30 PM

## 2013-10-24 ENCOUNTER — Ambulatory Visit: Payer: BC Managed Care – PPO | Admitting: Physical Therapy

## 2013-10-28 ENCOUNTER — Ambulatory Visit: Payer: BC Managed Care – PPO | Admitting: Physical Therapy

## 2013-10-28 DIAGNOSIS — IMO0001 Reserved for inherently not codable concepts without codable children: Secondary | ICD-10-CM | POA: Diagnosis not present

## 2013-10-31 ENCOUNTER — Ambulatory Visit: Payer: BC Managed Care – PPO | Admitting: Physical Therapy

## 2013-10-31 DIAGNOSIS — IMO0001 Reserved for inherently not codable concepts without codable children: Secondary | ICD-10-CM | POA: Diagnosis not present

## 2013-11-04 ENCOUNTER — Ambulatory Visit: Payer: BC Managed Care – PPO | Admitting: Physical Therapy

## 2013-11-04 DIAGNOSIS — IMO0001 Reserved for inherently not codable concepts without codable children: Secondary | ICD-10-CM | POA: Diagnosis not present

## 2013-11-07 ENCOUNTER — Ambulatory Visit: Payer: BC Managed Care – PPO | Admitting: Physical Therapy

## 2013-11-07 DIAGNOSIS — IMO0001 Reserved for inherently not codable concepts without codable children: Secondary | ICD-10-CM | POA: Diagnosis not present

## 2013-11-18 ENCOUNTER — Ambulatory Visit: Payer: BC Managed Care – PPO | Attending: Family Medicine | Admitting: Physical Therapy

## 2013-11-18 ENCOUNTER — Ambulatory Visit (INDEPENDENT_AMBULATORY_CARE_PROVIDER_SITE_OTHER): Payer: BC Managed Care – PPO | Admitting: Internal Medicine

## 2013-11-18 VITALS — BP 124/64 | HR 67 | Temp 98.2°F | Resp 16 | Ht 61.0 in | Wt 166.0 lb

## 2013-11-18 DIAGNOSIS — S4991XA Unspecified injury of right shoulder and upper arm, initial encounter: Secondary | ICD-10-CM

## 2013-11-18 DIAGNOSIS — Z7689 Persons encountering health services in other specified circumstances: Secondary | ICD-10-CM

## 2013-11-18 DIAGNOSIS — R5381 Other malaise: Secondary | ICD-10-CM | POA: Insufficient documentation

## 2013-11-18 DIAGNOSIS — M25519 Pain in unspecified shoulder: Secondary | ICD-10-CM | POA: Insufficient documentation

## 2013-11-18 DIAGNOSIS — Z0189 Encounter for other specified special examinations: Secondary | ICD-10-CM

## 2013-11-18 DIAGNOSIS — R293 Abnormal posture: Secondary | ICD-10-CM | POA: Insufficient documentation

## 2013-11-18 DIAGNOSIS — M25619 Stiffness of unspecified shoulder, not elsewhere classified: Secondary | ICD-10-CM | POA: Insufficient documentation

## 2013-11-18 DIAGNOSIS — IMO0001 Reserved for inherently not codable concepts without codable children: Secondary | ICD-10-CM | POA: Insufficient documentation

## 2013-11-18 NOTE — Progress Notes (Signed)
   Subjective:    Patient ID: Carla [name], female    DOB: 1966/04/22, 48 y.o.   MRN: [id number]  HPI 48 year old woman here with cc of need for re evaluation of her right shoulder. Pt has completed her 3.5 week coarse of physical therapy today. She is feeling much better has no more pain in her right shoulder and is requesting a note to return to work. She has been out of work because of this injury since February. 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   Review of Systems  Musculoskeletal: Negative.   All other systems reviewed and are negative.      Objective:   Physical Exam  Nursing note and vitals reviewed. Constitutional: She is oriented to person, place, and time. She appears well-developed and well-nourished.  HENT:  Head: Normocephalic and atraumatic.  Eyes: Conjunctivae and EOM are normal. Pupils are equal, round, and reactive to light.  Neck: Normal range of motion. Neck supple.  Cardiovascular: Normal rate, regular rhythm and normal heart sounds.   Pulmonary/Chest: Effort normal and breath sounds normal.  Abdominal: Soft. Bowel sounds are normal.  Musculoskeletal: Normal range of motion.  Good strength 5/5 in her right shoulder no weakness or pain on abduction or adduction elevation or flexion, normal nonpainful ROM of the right sh older.  Neurological: She is alert and oriented to person, place, and time.  Skin: Skin is warm and dry.  Psychiatric: She has a normal mood and affect. Her behavior is normal. Judgment and thought content normal.          Assessment & Plan:  Pt has full use of her right shoulder without pain and will be able to return to her full work activities. 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

## 2013-11-18 NOTE — Patient Instructions (Signed)
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

## 2013-11-20 ENCOUNTER — Encounter: Payer: BC Managed Care – PPO | Admitting: Physical Therapy

## 2014-05-30 ENCOUNTER — Other Ambulatory Visit (HOSPITAL_COMMUNITY): Payer: Self-pay | Admitting: Family Medicine

## 2014-05-30 DIAGNOSIS — Z1231 Encounter for screening mammogram for malignant neoplasm of breast: Secondary | ICD-10-CM

## 2014-06-23 ENCOUNTER — Ambulatory Visit (HOSPITAL_COMMUNITY)
Admission: RE | Admit: 2014-06-23 | Discharge: 2014-06-23 | Disposition: A | Discharge status: Home / Self Care | Payer: BC Managed Care – PPO | Source: Ambulatory Visit | Attending: Family Medicine | Admitting: Family Medicine

## 2014-06-23 DIAGNOSIS — Z1231 Encounter for screening mammogram for malignant neoplasm of breast: Secondary | ICD-10-CM

## 2014-06-24 ENCOUNTER — Other Ambulatory Visit: Payer: Self-pay | Admitting: Family Medicine

## 2014-06-24 DIAGNOSIS — R928 Other abnormal and inconclusive findings on diagnostic imaging of breast: Secondary | ICD-10-CM

## 2014-06-25 LAB — LAB REPORT - SCANNED: PAP SMEAR: NEGATIVE

## 2014-06-25 LAB — HM PAP SMEAR: HM Pap smear: NEGATIVE

## 2014-07-10 ENCOUNTER — Ambulatory Visit
Admission: RE | Admit: 2014-07-10 | Discharge: 2014-07-10 | Disposition: A | Discharge status: Home / Self Care | Payer: BC Managed Care – PPO | Source: Ambulatory Visit | Attending: Family Medicine | Admitting: Family Medicine

## 2014-07-10 DIAGNOSIS — R928 Other abnormal and inconclusive findings on diagnostic imaging of breast: Secondary | ICD-10-CM

## 2016-02-27 ENCOUNTER — Encounter: Payer: Self-pay | Admitting: *Deleted

## 2016-03-05 ENCOUNTER — Encounter: Payer: Self-pay | Admitting: *Deleted

## 2016-05-13 ENCOUNTER — Other Ambulatory Visit: Payer: Self-pay | Admitting: Family Medicine

## 2016-05-13 DIAGNOSIS — N6310 Unspecified lump in the right breast, unspecified quadrant: Secondary | ICD-10-CM

## 2016-05-19 ENCOUNTER — Ambulatory Visit
Admission: RE | Admit: 2016-05-19 | Discharge: 2016-05-19 | Disposition: A | Discharge status: Home / Self Care | Payer: BLUE CROSS/BLUE SHIELD | Source: Ambulatory Visit | Attending: Family Medicine | Admitting: Family Medicine

## 2016-05-19 DIAGNOSIS — N6310 Unspecified lump in the right breast, unspecified quadrant: Secondary | ICD-10-CM

## 2016-10-06 ENCOUNTER — Encounter: Payer: Self-pay | Admitting: Obstetrics and Gynecology

## 2016-10-06 ENCOUNTER — Ambulatory Visit (INDEPENDENT_AMBULATORY_CARE_PROVIDER_SITE_OTHER): Payer: BLUE CROSS/BLUE SHIELD | Admitting: Obstetrics and Gynecology

## 2016-10-06 VITALS — BP 116/74 | HR 64 | Resp 18 | Ht 61.0 in | Wt 171.0 lb

## 2016-10-06 DIAGNOSIS — Z01419 Encounter for gynecological examination (general) (routine) without abnormal findings: Secondary | ICD-10-CM

## 2016-10-06 DIAGNOSIS — N946 Dysmenorrhea, unspecified: Secondary | ICD-10-CM

## 2016-10-06 DIAGNOSIS — Z124 Encounter for screening for malignant neoplasm of cervix: Secondary | ICD-10-CM | POA: Diagnosis not present

## 2016-10-06 MED ORDER — NAPROXEN SODIUM 550 MG PO TABS: 550.0000 mg | ORAL_TABLET | Freq: Two times a day (BID) | ORAL | 2 refills | Status: DC

## 2016-10-06 NOTE — Patient Instructions (Signed)

## 2016-10-06 NOTE — Progress Notes (Signed)
Patient ID: Carla [name], female   DOB: [birth year], 51 y.o.   MRN: [id number]  51 y.o. [id number] MarriedAfrican AmericanF here for annual exam.   Period Cycle (Days): 28 Period Duration (Days): 4-5 Period Pattern: Regular Menstrual Flow: Moderate Menstrual Control: Maxi pad Menstrual Control Change Freq (Hours): 4 times per day on heaviest day Dysmenorrhea: (!) Severe Dysmenorrhea Symptoms: Cramping, Headache  She c/o severe cramps for one day, helped with ibuprofen. No pain with intercourse.   Patient's last menstrual period was 09/28/2016 (exact date).          Sexually active: Yes.    The current method of family planning is tubal ligation.    Exercising: Yes.    walking everyday Smoker:  no  Health Maintenance: Pap: 06/25/14, Negative History of abnormal Pap:  no MMG:  05/19/16, Bi-Rads 1:  Negative Colonoscopy: Never, has consult appointment on 10/10/16 BMD:   Never TDaP: 2017 Gardasil: [name]   reports that she has never smoked. She has never used smokeless tobacco. She reports that she does not drink alcohol or use drugs. 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  3 kids, 21, 12 and 10. Son is in Social [place] in [place].   Past Medical History:  Diagnosis Date  . Anemia   . Hypertension   Anemic her whole life, on iron  Past Surgical History:  Procedure Laterality Date  . CESAREAN SECTION    . CHOLECYSTECTOMY  1998  . UMBILICAL HERNIA REPAIR  1999  C/S x 3  Current Outpatient Prescriptions  Medication Sig Dispense Refill  . iron polysaccharides (NIFEREX) 150 MG capsule Take 150 mg by mouth daily.    Marland Kitchen. lisinopril-hydrochlorothiazide (PRINZIDE,ZESTORETIC) 20-25 MG per tablet Take 1 tablet by mouth daily.     No current facility-administered medications for this visit.     History reviewed. No pertinent family history.  Review of Systems  All other systems reviewed and are negative.   Exam:   BP 116/74 (BP Location: Right Arm, Patient Position: Sitting, Cuff  Size: Normal)   Pulse 64   Resp 18   Ht 5\' 1"  (1.549 m)   Wt 171 lb (77.6 kg)   LMP 09/28/2016 (Exact Date)   BMI 32.31 kg/m   Weight change: @WEIGHTCHANGE @ Height:   Height: 5\' 1"  (154.9 cm)  Ht Readings from Last 3 Encounters:  10/06/16 5\' 1"  (1.549 m)  11/18/13 5\' 1"  (1.549 m)  10/23/13 5' 1.5" (1.562 m)    General appearance: alert, cooperative and appears stated age Head: Normocephalic, without obvious abnormality, atraumatic Neck: no adenopathy, supple, symmetrical, trachea midline and thyroid normal to inspection and palpation Lungs: clear to auscultation bilaterally Cardiovascular: regular rate and rhythm Breasts: normal appearance, no masses or tenderness Abdomen: soft, non-tender; bowel sounds normal; no masses,  no organomegaly Extremities: extremities normal, atraumatic, no cyanosis or edema Skin: Skin color, texture, turgor normal. No rashes or lesions Lymph nodes: Cervical, supraclavicular, and axillary nodes normal. No abnormal inguinal nodes palpated Neurologic: Grossly normal   Pelvic: External genitalia:  no lesions              Urethra:  normal appearing urethra with no masses, tenderness or lesions              Bartholins and Skenes: normal                 Vagina: normal appearing vagina with normal color and discharge, no lesions  Cervix: no lesions               Bimanual Exam:  Uterus:  normal size, contour, position, consistency, mobility, non-tender and anteverted              Adnexa: no mass, fullness, tenderness               Rectovaginal: Confirms               Anus:  normal sphincter tone, no lesions  Chaperone was present for exam.  A:  Well Woman with normal exam  Dysmenorrhea  P:   Labs with her primary MD  Pap with hpv  Colonoscopy scheduled  Mammogram due in 9/18  Discussed breast self exam  Discussed calcium and vit D intake  Anaprox for cramps

## 2016-10-10 ENCOUNTER — Telehealth: Payer: Self-pay | Admitting: *Deleted

## 2016-10-10 LAB — IPS PAP TEST WITH HPV

## 2016-10-10 NOTE — Telephone Encounter (Signed)
Left message with Jerilynn Som to have patient return call -eh

## 2016-10-12 NOTE — Telephone Encounter (Signed)
Spoke with patient and gave results -eh  

## 2017-06-26 ENCOUNTER — Ambulatory Visit
Admission: RE | Admit: 2017-06-26 | Discharge: 2017-06-26 | Disposition: A | Discharge status: Home / Self Care | Payer: BLUE CROSS/BLUE SHIELD | Source: Ambulatory Visit | Attending: Family Medicine | Admitting: Family Medicine

## 2017-06-26 ENCOUNTER — Other Ambulatory Visit: Payer: Self-pay | Admitting: Family Medicine

## 2017-06-26 DIAGNOSIS — Z1231 Encounter for screening mammogram for malignant neoplasm of breast: Secondary | ICD-10-CM

## 2017-10-12 ENCOUNTER — Other Ambulatory Visit: Payer: Self-pay

## 2017-10-12 ENCOUNTER — Ambulatory Visit (INDEPENDENT_AMBULATORY_CARE_PROVIDER_SITE_OTHER): Payer: BLUE CROSS/BLUE SHIELD | Admitting: Obstetrics and Gynecology

## 2017-10-12 ENCOUNTER — Encounter: Payer: Self-pay | Admitting: Obstetrics and Gynecology

## 2017-10-12 VITALS — BP 122/70 | HR 60 | Resp 14 | Ht 61.0 in | Wt 168.0 lb

## 2017-10-12 DIAGNOSIS — Z01419 Encounter for gynecological examination (general) (routine) without abnormal findings: Secondary | ICD-10-CM

## 2017-10-12 NOTE — Patient Instructions (Signed)

## 2017-10-12 NOTE — Progress Notes (Signed)
52 y.o. [id number] MarriedAfrican AmericanF here for annual exam.   Period Cycle (Days): 28 Period Duration (Days): 4 days  Period Pattern: Regular Menstrual Flow: Heavy Menstrual Control: Maxi pad Menstrual Control Change Freq (Hours): changes pad twice during the day Dysmenorrhea: None  No vasomotor symptoms. Sexually active, no pain with intercourse.   Patient's last menstrual period was 09/30/2017.          Sexually active: Yes.    The current method of family planning is tubal ligation.    Exercising: Yes/ walking  Smoker:  no  Health Maintenance: Pap:  10-06-16 WNL NEG HR HPV  History of abnormal Pap:  no MMG:  06-26-17 WNL  Colonoscopy:  10/2016 Normal per patient  BMD:   Never TDaP:  2017  Gardasil: [name]   reports that she has never smoked. She has never used smokeless tobacco. She reports that she does not drink alcohol or use drugs.  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  3 kids, 22, 13 and 11.   Past Medical History:  Diagnosis Date  . Anemia   . Hypertension     Past Surgical History:  Procedure Laterality Date  . CESAREAN SECTION    . CHOLECYSTECTOMY  1998  . UMBILICAL HERNIA REPAIR  1999    Current Outpatient Medications  Medication Sig Dispense Refill  . iron polysaccharides (NIFEREX) 150 MG capsule Take 150 mg by mouth daily.    Marland Kitchen. lisinopril-hydrochlorothiazide (PRINZIDE,ZESTORETIC) 20-25 MG per tablet Take 1 tablet by mouth daily.     No current facility-administered medications for this visit.     History reviewed. No pertinent family history.  Review of Systems  Constitutional: Negative.   HENT: Negative.   Eyes: Negative.   Respiratory: Negative.   Cardiovascular: Negative.   Gastrointestinal: Negative.   Endocrine: Negative.   Genitourinary: Negative.   Musculoskeletal: Negative.   Skin: Negative.   Allergic/Immunologic: Negative.   Neurological: Negative.   Psychiatric/Behavioral: Negative.     Exam:   BP 122/70 (BP  Location: Right Arm, Patient Position: Sitting, Cuff Size: Normal)   Pulse 60   Resp 14   Ht 5\' 1"  (1.549 m)   Wt 168 lb (76.2 kg)   LMP 09/30/2017   BMI 31.74 kg/m   Weight change: @WEIGHTCHANGE @ Height:   Height: 5\' 1"  (154.9 cm)  Ht Readings from Last 3 Encounters:  10/12/17 5\' 1"  (1.549 m)  10/06/16 5\' 1"  (1.549 m)  11/18/13 5\' 1"  (1.549 m)    General appearance: alert, cooperative and appears stated age Head: Normocephalic, without obvious abnormality, atraumatic Neck: no adenopathy, supple, symmetrical, trachea midline and thyroid normal to inspection and palpation Lungs: clear to auscultation bilaterally Cardiovascular: regular rate and rhythm Breasts: normal appearance, no masses or tenderness Abdomen: soft, non-tender; non distended,  no masses,  no organomegaly Extremities: extremities normal, atraumatic, no cyanosis or edema Skin: Skin color, texture, turgor normal. No rashes or lesions Lymph nodes: Cervical, supraclavicular, and axillary nodes normal. No abnormal inguinal nodes palpated Neurologic: Grossly normal   Pelvic: External genitalia:  no lesions              Urethra:  normal appearing urethra with no masses, tenderness or lesions              Bartholins and Skenes: normal                 Vagina: normal appearing vagina with normal color and discharge, no lesions  Cervix: no lesions               Bimanual Exam:  Uterus:  normal size, contour, position, consistency, mobility, non-tender              Adnexa: no mass, fullness, tenderness               Rectovaginal: Confirms               Anus:  normal sphincter tone, no lesions  Chaperone was present for exam.  A:  Well Woman with normal exam  P:   No pap this year  Mammogram and colonoscopy UTD  Discussed breast self exam  Discussed calcium and vit D intake  Labs with primary MD

## 2018-06-15 ENCOUNTER — Other Ambulatory Visit: Payer: Self-pay | Admitting: Family Medicine

## 2018-06-15 DIAGNOSIS — Z1231 Encounter for screening mammogram for malignant neoplasm of breast: Secondary | ICD-10-CM

## 2018-06-29 ENCOUNTER — Ambulatory Visit
Admission: RE | Admit: 2018-06-29 | Discharge: 2018-06-29 | Disposition: A | Discharge status: Home / Self Care | Payer: BLUE CROSS/BLUE SHIELD | Source: Ambulatory Visit | Attending: Family Medicine | Admitting: Family Medicine

## 2018-06-29 DIAGNOSIS — Z1231 Encounter for screening mammogram for malignant neoplasm of breast: Secondary | ICD-10-CM | POA: Diagnosis not present

## 2018-11-27 ENCOUNTER — Other Ambulatory Visit: Payer: Self-pay

## 2018-11-29 ENCOUNTER — Ambulatory Visit (INDEPENDENT_AMBULATORY_CARE_PROVIDER_SITE_OTHER): Payer: BLUE CROSS/BLUE SHIELD | Admitting: Obstetrics and Gynecology

## 2018-11-29 ENCOUNTER — Encounter: Payer: Self-pay | Admitting: Obstetrics and Gynecology

## 2018-11-29 ENCOUNTER — Other Ambulatory Visit: Payer: Self-pay

## 2018-11-29 VITALS — BP 118/72 | HR 72 | Temp 98.0°F | Ht 61.0 in | Wt 169.8 lb

## 2018-11-29 DIAGNOSIS — Z01419 Encounter for gynecological examination (general) (routine) without abnormal findings: Secondary | ICD-10-CM

## 2018-11-29 NOTE — Progress Notes (Signed)
53 y.o. [id number] Married Black or [place] American Not Hispanic or Latino female here for annual exam.   Husband just got his masters in Social Work.   Period Cycle (Days): 28 Period Duration (Days): 3-4 days Period Pattern: Regular Menstrual Flow: Moderate, Light Menstrual Control: Thin pad Menstrual Control Change Freq (Hours): changes pad every 2 hours Dysmenorrhea: (!) Mild Dysmenorrhea Symptoms: Cramping  Cycles are lighter than they used to be. She takes iron, doesn't thinks she is anemic any more.  She is very hot. No vasomotor symptoms. No vaginal dryness or dyspareunia.  Patient's last menstrual period was 11/15/2018 (exact date).          Sexually active: Yes.    The current method of family planning is tubal ligation.    Exercising: Yes.    walking Smoker:  no  Health Maintenance: Pap:  10-06-16 WNL NEG HR HPV  History of abnormal Pap:  no MMG:  06/29/2018 Birads 1 negative Colonoscopy:  10/2016 Normal per patient  BMD:   Never TDaP:  2017  Gardasil: [name]   reports that she has never smoked. She has never used smokeless tobacco. She reports that she does not drink alcohol or use drugs. Works as a Agricultural engineer at [employer].  3 kids, 23, 14 and 12. Oldest son is at [employer]   Past Medical History:  Diagnosis Date  . Anemia   . Hypertension     Past Surgical History:  Procedure Laterality Date  . CESAREAN SECTION    . CHOLECYSTECTOMY  1998  . UMBILICAL HERNIA REPAIR  1999    Current Outpatient Medications  Medication Sig Dispense Refill  . iron polysaccharides (NIFEREX) 150 MG capsule Take 150 mg by mouth daily.    Marland Kitchen lisinopril-hydrochlorothiazide (PRINZIDE,ZESTORETIC) 20-25 MG per tablet Take 1 tablet by mouth daily.     No current facility-administered medications for this visit.     History reviewed. No pertinent family history.  Review of Systems  Constitutional: Negative.   HENT: Negative.   Eyes: Negative.   Respiratory: Negative.    Cardiovascular: Negative.   Gastrointestinal: Negative.   Endocrine: Negative.   Genitourinary: Negative.   Musculoskeletal: Negative.   Skin: Negative.   Allergic/Immunologic: Negative.   Neurological: Negative.   Hematological: Negative.   Psychiatric/Behavioral: Negative.     Exam:   BP 118/72 (BP Location: Right Arm, Patient Position: Sitting, Cuff Size: Large)   Pulse 72   Temp 98 F (36.7 C) (Skin)   Ht 5\' 1"  (1.549 m)   Wt 169 lb 12.8 oz (77 kg)   LMP 11/15/2018 (Exact Date)   BMI 32.08 kg/m   Weight change: @WEIGHTCHANGE @ Height:   Height: 5\' 1"  (154.9 cm)  Ht Readings from Last 3 Encounters:  11/29/18 5\' 1"  (1.549 m)  10/12/17 5\' 1"  (1.549 m)  10/06/16 5\' 1"  (1.549 m)    General appearance: alert, cooperative and appears stated age Head: Normocephalic, without obvious abnormality, atraumatic Neck: no adenopathy, supple, symmetrical, trachea midline and thyroid normal to inspection and palpation Lungs: clear to auscultation bilaterally Cardiovascular: regular rate and rhythm Breasts: normal appearance, no masses or tenderness Abdomen: soft, non-tender; non distended,  no masses,  no organomegaly Extremities: extremities normal, atraumatic, no cyanosis or edema Skin: Skin color, texture, turgor normal. No rashes or lesions Lymph nodes: Cervical, supraclavicular, and axillary nodes normal. No abnormal inguinal nodes palpated Neurologic: Grossly normal   Pelvic: External genitalia:  no lesions  Urethra:  normal appearing urethra with no masses, tenderness or lesions              Bartholins and Skenes: normal                 Vagina: normal appearing vagina with normal color and discharge, no lesions              Cervix: no lesions               Bimanual Exam:  Uterus:  normal size, contour, position, consistency, mobility, non-tender and anteverted              Adnexa: no mass, fullness, tenderness               Rectovaginal: Confirms                Anus:  normal sphincter tone, no lesions  Chaperone was present for exam.  A:  Well Woman with normal exam  H/O anemia, on iron, thinks her labs are normal  P:   Pap next year  Discussed breast self exam  Discussed calcium and vit D intake  Labs with primary, will get a copy  Mammogram in 12/20  Colonoscopy UTD

## 2018-11-29 NOTE — Patient Instructions (Addendum)
EXERCISE AND DIET:  We recommended that you start or continue a regular exercise program for good health. Regular exercise means any activity that makes your heart beat faster and makes you sweat.  We recommend exercising at least 30 minutes per day at least 3 days a week, preferably 4 or 5.  We also recommend a diet low in fat and sugar.  Inactivity, poor dietary choices and obesity can cause diabetes, heart attack, stroke, and kidney damage, among others.    ALCOHOL AND SMOKING:  Women should limit their alcohol intake to no more than 7 drinks/beers/glasses of wine (combined, not each!) per week. Moderation of alcohol intake to this level decreases your risk of breast cancer and liver damage. And of course, no recreational drugs are part of a healthy lifestyle.  And absolutely no smoking or even second hand smoke. Most people know smoking can cause heart and lung diseases, but did you know it also contributes to weakening of your bones? Aging of your skin?  Yellowing of your teeth and nails?  CALCIUM AND VITAMIN D:  Adequate intake of calcium and Vitamin D are recommended.  The recommendations for exact amounts of these supplements seem to change often, but generally speaking 1,000  mg of calcium (between diet and supplement) and 800 units of Vitamin D per day seems prudent. Certain women may benefit from higher intake of Vitamin D.  If you are among these women, your doctor will have told you during your visit.    PAP SMEARS:  Pap smears, to check for cervical cancer or precancers,  have traditionally been done yearly, although recent scientific advances have shown that most women can have pap smears less often.  However, every woman still should have a physical exam from her gynecologist every year. It will include a breast check, inspection of the vulva and vagina to check for abnormal growths or skin changes, a visual exam of the cervix, and then an exam to evaluate the size and shape of the uterus and  ovaries.  And after 53 years of age, a rectal exam is indicated to check for rectal cancers. We will also provide age appropriate advice regarding health maintenance, like when you should have certain vaccines, screening for sexually transmitted diseases, bone density testing, colonoscopy, mammograms, etc.   MAMMOGRAMS:  All women over 16 years old should have a yearly mammogram. Many facilities now offer a "3D" mammogram, which may cost around $50 extra out of pocket. If possible,  we recommend you accept the option to have the 3D mammogram performed.  It both reduces the number of women who will be called back for extra views which then turn out to be normal, and it is better than the routine mammogram at detecting truly abnormal areas.    COLON CANCER SCREENING: Now recommend starting at age 5. At this time colonoscopy is not covered for routine screening until 50. There are take home tests that can be done between 45-49.   COLONOSCOPY:  Colonoscopy to screen for colon cancer is recommended for all women at age 5.  We know, you hate the idea of the prep.  We agree, BUT, having colon cancer and not knowing it is worse!!  Colon cancer so often starts as a polyp that can be seen and removed at colonscopy, which can quite literally save your life!  And if your first colonoscopy is normal and you have no family history of colon cancer, most women don't have to have it again  for 10 years.  Once every ten years, you can do something that may end up saving your life, right?  We will be happy to help you get it scheduled when you are ready.  Be sure to check your insurance coverage so you understand how much it will cost.  It may be covered as a preventative service at no cost, but you should check your particular policy.      Breast Self-Awareness Breast self-awareness means being familiar with how your breasts look and feel. It involves checking your breasts regularly and reporting any changes to your  health care provider. Practicing breast self-awareness is important. A change in your breasts can be a sign of a serious medical problem. Being familiar with how your breasts look and feel allows you to find any problems early, when treatment is more likely to be successful. All women should practice breast self-awareness, including women who have had breast implants. How to do a breast self-exam One way to learn what is normal for your breasts and whether your breasts are changing is to do a breast self-exam. To do a breast self-exam: Look for Changes  1. Remove all the clothing above your waist. 2. Stand in front of a mirror in a room with good lighting. 3. Put your hands on your hips. 4. Push your hands firmly downward. 5. Compare your breasts in the mirror. Look for differences between them (asymmetry), such as: ? Differences in shape. ? Differences in size. ? Puckers, dips, and bumps in one breast and not the other. 6. Look at each breast for changes in your skin, such as: ? Redness. ? Scaly areas. 7. Look for changes in your nipples, such as: ? Discharge. ? Bleeding. ? Dimpling. ? Redness. ? A change in position. Feel for Changes Carefully feel your breasts for lumps and changes. It is best to do this while lying on your back on the floor and again while sitting or standing in the shower or tub with soapy water on your skin. Feel each breast in the following way:  Place the arm on the side of the breast you are examining above your head.  Feel your breast with the other hand.  Start in the nipple area and make  inch (2 cm) overlapping circles to feel your breast. Use the pads of your three middle fingers to do this. Apply light pressure, then medium pressure, then firm pressure. The light pressure will allow you to feel the tissue closest to the skin. The medium pressure will allow you to feel the tissue that is a little deeper. The firm pressure will allow you to feel the tissue  close to the ribs.  Continue the overlapping circles, moving downward over the breast until you feel your ribs below your breast.  Move one finger-width toward the center of the body. Continue to use the  inch (2 cm) overlapping circles to feel your breast as you move slowly up toward your collarbone.  Continue the up and down exam using all three pressures until you reach your armpit.  Write Down What You Find  Write down what is normal for each breast and any changes that you find. Keep a written record with breast changes or normal findings for each breast. By writing this information down, you do not need to depend only on memory for size, tenderness, or location. Write down where you are in your menstrual cycle, if you are still menstruating. If you are having trouble noticing  differences in your breasts, do not get discouraged. With time you will become more familiar with the variations in your breasts and more comfortable with the exam. How often should I examine my breasts? Examine your breasts every month. If you are breastfeeding, the best time to examine your breasts is after a feeding or after using a breast pump. If you menstruate, the best time to examine your breasts is 5-7 days after your period is over. During your period, your breasts are lumpier, and it may be more difficult to notice changes. When should I see my health care provider? See your health care provider if you notice:  A change in shape or size of your breasts or nipples.  A change in the skin of your breast or nipples, such as a reddened or scaly area.  Unusual discharge from your nipples.  A lump or thick area that was not there before.  Pain in your breasts.  Anything that concerns you.    Menopause Menopause is the normal time of life when menstrual periods stop completely. It is usually confirmed by 12 months without a menstrual period. The transition to menopause (perimenopause) most often happens  between the ages of 29 and 6. During perimenopause, hormone levels change in your body, which can cause symptoms and affect your health. Menopause may increase your risk for:  Loss of bone (osteoporosis), which causes bone breaks (fractures).  Depression.  Hardening and narrowing of the arteries (atherosclerosis), which can cause heart attacks and strokes. What are the causes? This condition is usually caused by a natural change in hormone levels that happens as you get older. The condition may also be caused by surgery to remove both ovaries (bilateral oophorectomy). What increases the risk? This condition is more likely to start at an earlier age if you have certain medical conditions or treatments, including:  A tumor of the pituitary gland in the brain.  A disease that affects the ovaries and hormone production.  Radiation treatment for cancer.  Certain cancer treatments, such as chemotherapy or hormone (anti-estrogen) therapy.  Heavy smoking and excessive alcohol use.  Family history of early menopause. This condition is also more likely to develop earlier in women who are very thin. What are the signs or symptoms? Symptoms of this condition include:  Hot flashes.  Irregular menstrual periods.  Night sweats.  Changes in feelings about sex. This could be a decrease in sex drive or an increased comfort around your sexuality.  Vaginal dryness and thinning of the vaginal walls. This may cause painful intercourse.  Dryness of the skin and development of wrinkles.  Headaches.  Problems sleeping (insomnia).  Mood swings or irritability.  Memory problems.  Weight gain.  Hair growth on the face and chest.  Bladder infections or problems with urinating. How is this diagnosed? This condition is diagnosed based on your medical history, a physical exam, your age, your menstrual history, and your symptoms. Hormone tests may also be done. How is this treated? In some  cases, no treatment is needed. You and your health care provider should make a decision together about whether treatment is necessary. Treatment will be based on your individual condition and preferences. Treatment for this condition focuses on managing symptoms. Treatment may include:  Menopausal hormone therapy (MHT).  Medicines to treat specific symptoms or complications.  Acupuncture.  Vitamin or herbal supplements. Before starting treatment, make sure to let your health care provider know if you have a personal or family history of:  Heart disease.  Breast cancer.  Blood clots.  Diabetes.  Osteoporosis. Follow these instructions at home: Lifestyle  Do not use any products that contain nicotine or tobacco, such as cigarettes and e-cigarettes. If you need help quitting, ask your health care provider.  Get at least 30 minutes of physical activity on 5 or more days each week.  Avoid alcoholic and caffeinated beverages, as well as spicy foods. This may help prevent hot flashes.  Get 7-8 hours of sleep each night.  If you have hot flashes, try: ? Dressing in layers. ? Avoiding things that may trigger hot flashes, such as spicy food, warm places, or stress. ? Taking slow, deep breaths when a hot flash starts. ? Keeping a fan in your home and office.  Find ways to manage stress, such as deep breathing, meditation, or journaling.  Consider going to group therapy with other women who are having menopause symptoms. Ask your health care provider about recommended group therapy meetings. Eating and drinking  Eat a healthy, balanced diet that contains whole grains, lean protein, low-fat dairy, and plenty of fruits and vegetables.  Your health care provider may recommend adding more soy to your diet. Foods that contain soy include tofu, tempeh, and soy milk.  Eat plenty of foods that contain calcium and vitamin D for bone health. Items that are rich in calcium include low-fat  milk, yogurt, beans, almonds, sardines, broccoli, and kale. Medicines  Take over-the-counter and prescription medicines only as told by your health care provider.  Talk with your health care provider before starting any herbal supplements. If prescribed, take vitamins and supplements as told by your health care provider. These may include: ? Calcium. Women age 43 and older should get 1,200 mg (milligrams) of calcium every day. ? Vitamin D. Women need 600-800 International Units of vitamin D each day. ? Vitamins B12 and B6. Aim for 50 micrograms of B12 and 1.5 mg of B6 each day. General instructions  Keep track of your menstrual periods, including: ? When they occur. ? How heavy they are and how long they last. ? How much time passes between periods.  Keep track of your symptoms, noting when they start, how often you have them, and how long they last.  Use vaginal lubricants or moisturizers to help with vaginal dryness and improve comfort during sex.  Keep all follow-up visits as told by your health care provider. This is important. This includes any group therapy or counseling. Contact a health care provider if:  You are still having menstrual periods after age 31.  You have pain during sex.  You have not had a period for 12 months and you develop vaginal bleeding. Get help right away if:  You have: ? Severe depression. ? Excessive vaginal bleeding. ? Pain when you urinate. ? A fast or irregular heart beat (palpitations). ? Severe headaches. ? Abdomen (abdominal) pain or severe indigestion.  You fell and you think you have a broken bone.  You develop leg or chest pain.  You develop vision problems.  You feel a lump in your breast. Summary  Menopause is the normal time of life when menstrual periods stop completely. It is usually confirmed by 12 months without a menstrual period.  The transition to menopause (perimenopause) most often happens between the ages of 16 and  27.  Symptoms can be managed through medicines, lifestyle changes, and complementary therapies such as acupuncture.  Eat a balanced diet that is rich in nutrients to promote bone  health and heart health and to manage symptoms during menopause. This information is not intended to replace advice given to you by your health care provider. Make sure you discuss any questions you have with your health care provider. Document Released: 09/17/2003 Document Revised: 07/30/2016 Document Reviewed: 07/30/2016 Elsevier Interactive Patient Education  2019 [employer]

## 2018-12-10 ENCOUNTER — Other Ambulatory Visit: Payer: Self-pay | Admitting: Obstetrics and Gynecology

## 2018-12-10 DIAGNOSIS — Z862 Personal history of diseases of the blood and blood-forming organs and certain disorders involving the immune mechanism: Secondary | ICD-10-CM

## 2018-12-10 DIAGNOSIS — Z0000 Encounter for general adult medical examination without abnormal findings: Secondary | ICD-10-CM

## 2018-12-10 DIAGNOSIS — Z6832 Body mass index (BMI) 32.0-32.9, adult: Secondary | ICD-10-CM

## 2018-12-11 ENCOUNTER — Other Ambulatory Visit: Payer: Self-pay

## 2018-12-11 ENCOUNTER — Other Ambulatory Visit (INDEPENDENT_AMBULATORY_CARE_PROVIDER_SITE_OTHER): Payer: BLUE CROSS/BLUE SHIELD

## 2018-12-11 DIAGNOSIS — Z0000 Encounter for general adult medical examination without abnormal findings: Secondary | ICD-10-CM

## 2018-12-11 DIAGNOSIS — Z6832 Body mass index (BMI) 32.0-32.9, adult: Secondary | ICD-10-CM

## 2018-12-11 DIAGNOSIS — Z862 Personal history of diseases of the blood and blood-forming organs and certain disorders involving the immune mechanism: Secondary | ICD-10-CM

## 2018-12-12 ENCOUNTER — Telehealth: Payer: Self-pay

## 2018-12-12 ENCOUNTER — Encounter: Payer: Self-pay | Admitting: Obstetrics and Gynecology

## 2018-12-12 DIAGNOSIS — R7303 Prediabetes: Secondary | ICD-10-CM

## 2018-12-12 DIAGNOSIS — R7309 Other abnormal glucose: Secondary | ICD-10-CM

## 2018-12-12 HISTORY — DX: Prediabetes: R73.03

## 2018-12-12 LAB — CBC
Hematocrit: 36.9 % (ref 34.0–46.6)
Hemoglobin: 11.3 g/dL (ref 11.1–15.9)
MCH: 22.4 pg — ABNORMAL LOW (ref 26.6–33.0)
MCHC: 30.6 g/dL — ABNORMAL LOW (ref 31.5–35.7)
MCV: 73 fL — ABNORMAL LOW (ref 79–97)
Platelets: 239 10*3/uL (ref 150–450)
RBC: 5.05 x10E6/uL (ref 3.77–5.28)
RDW: 16.6 % — ABNORMAL HIGH (ref 11.7–15.4)
WBC: 5.2 10*3/uL (ref 3.4–10.8)

## 2018-12-12 LAB — COMPREHENSIVE METABOLIC PANEL
ALT: 13 IU/L (ref 0–32)
AST: 10 IU/L (ref 0–40)
Albumin/Globulin Ratio: 2 (ref 1.2–2.2)
Albumin: 4.5 g/dL (ref 3.8–4.9)
Alkaline Phosphatase: 70 IU/L (ref 39–117)
BUN/Creatinine Ratio: 17 (ref 9–23)
BUN: 13 mg/dL (ref 6–24)
Bilirubin Total: 0.4 mg/dL (ref 0.0–1.2)
CO2: 21 mmol/L (ref 20–29)
Calcium: 9.5 mg/dL (ref 8.7–10.2)
Chloride: 105 mmol/L (ref 96–106)
Creatinine, Ser: 0.76 mg/dL (ref 0.57–1.00)
GFR calc Af Amer: 104 mL/min/{1.73_m2} (ref >59)
GFR calc non Af Amer: 90 mL/min/{1.73_m2} (ref >59)
Globulin, Total: 2.3 g/dL (ref 1.5–4.5)
Glucose: 86 mg/dL (ref 65–99)
Potassium: 3.9 mmol/L (ref 3.5–5.2)
Sodium: 141 mmol/L (ref 134–144)
Total Protein: 6.8 g/dL (ref 6.0–8.5)

## 2018-12-12 LAB — HEMOGLOBIN A1C
Est. average glucose Bld gHb Est-mCnc: 128 mg/dL
Hgb A1c MFr Bld: 6.1 % — ABNORMAL HIGH (ref 4.8–5.6)

## 2018-12-12 LAB — LIPID PANEL
Chol/HDL Ratio: 3.8 ratio (ref 0.0–4.4)
Cholesterol, Total: 177 mg/dL (ref 100–199)
HDL: 46 mg/dL (ref >39)
LDL Calculated: 118 mg/dL — ABNORMAL HIGH (ref 0–99)
Triglycerides: 66 mg/dL (ref 0–149)
VLDL Cholesterol Cal: 13 mg/dL (ref 5–40)

## 2018-12-12 LAB — TSH: TSH: 1.12 u[IU]/mL (ref 0.450–4.500)

## 2018-12-12 LAB — FERRITIN: Ferritin: 87 ng/mL (ref 15–150)

## 2018-12-12 NOTE — Telephone Encounter (Signed)
Spoke with patient. Results given. Patient states that she has never been told that she has thalassemia. Would like referral to diabetes management center. Referral placed. Patient is aware she will be contacted directly to schedule an appointment. Advised patient will speak with Dr.Jertson regarding thalassemia and return call.

## 2018-12-12 NOTE — Telephone Encounter (Signed)
-----   Message from Romualdo Bolk, MD sent at 12/12/2018  1:13 PM EDT ----- Please let the patient know that she is not anemic, but her indices are low which can go along with thalassemia. Has she ever been told that she had thalassemia? She is pre-diabetic, please send her to the prediabetes clinic. The rest of her lab work is okay.

## 2018-12-13 NOTE — Telephone Encounter (Signed)
Spoke with patient. Message given as seen below from Dr.Jertson. Patient verbalizes understanding. States that her PCP is Knox Royalty, MD. Results sent to Knox Royalty, MD. Patient verbalizes understanding. Encounter closed.

## 2018-12-13 NOTE — Telephone Encounter (Signed)
She is not anemic at this time, so I don't think she needs further evaluation for Thalassemia at this time. Please send a copy of her labs to her primary.

## 2019-06-04 ENCOUNTER — Other Ambulatory Visit: Payer: Self-pay | Admitting: Obstetrics and Gynecology

## 2019-06-04 DIAGNOSIS — Z1231 Encounter for screening mammogram for malignant neoplasm of breast: Secondary | ICD-10-CM

## 2019-07-25 ENCOUNTER — Other Ambulatory Visit: Payer: Self-pay

## 2019-07-25 ENCOUNTER — Ambulatory Visit
Admission: RE | Admit: 2019-07-25 | Discharge: 2019-07-25 | Disposition: A | Discharge status: Home / Self Care | Payer: BLUE CROSS/BLUE SHIELD | Source: Ambulatory Visit | Attending: Obstetrics and Gynecology | Admitting: Obstetrics and Gynecology

## 2019-07-25 DIAGNOSIS — Z1231 Encounter for screening mammogram for malignant neoplasm of breast: Secondary | ICD-10-CM | POA: Diagnosis not present

## 2019-12-03 NOTE — Progress Notes (Deleted)
54 y.o. G67P3003 Married Black or [place] American Not Hispanic or Latino female here for annual exam.      No LMP recorded.          Sexually active: {yes no:314532}  The current method of family planning is {contraception:315051}.    Exercising: {yes no:314532}  {types:19826} Smoker:  {YES [id number]  Health Maintenance: Pap:  10/06/16 WNL HR HPV Neg  History of abnormal Pap:  no MMG:  07/25/19 Density C Bi-rads 1 neg  BMD:   Never  Colonoscopy: 10/2016 normal per patient  TDaP:  2017 Gardasil: NA   reports that she has never smoked. She has never used smokeless tobacco. She reports that she does not drink alcohol or use drugs.  Past Medical History:  Diagnosis Date  . Anemia   . Hypertension   . Prediabetes 12/12/2018    Past Surgical History:  Procedure Laterality Date  . CESAREAN SECTION    . CHOLECYSTECTOMY  1998  . UMBILICAL HERNIA REPAIR  1999    Current Outpatient Medications  Medication Sig Dispense Refill  . iron polysaccharides (NIFEREX) 150 MG capsule Take 150 mg by mouth daily.    Marland Kitchen lisinopril-hydrochlorothiazide (PRINZIDE,ZESTORETIC) 20-25 MG per tablet Take 1 tablet by mouth daily.     No current facility-administered medications for this visit.    No family history on file.  Review of Systems  Exam:   There were no vitals taken for this visit.  Weight change: @WEIGHTCHANGE @ Height:      Ht Readings from Last 3 Encounters:  11/29/18 5\' 1"  (1.549 m)  10/12/17 5\' 1"  (1.549 m)  10/06/16 5\' 1"  (1.549 m)    General appearance: alert, cooperative and appears stated age Head: Normocephalic, without obvious abnormality, atraumatic Neck: no adenopathy, supple, symmetrical, trachea midline and thyroid {CHL AMB PHY EX THYROID NORM DEFAULT:[id number]::"normal to inspection and palpation"} Lungs: clear to auscultation bilaterally Cardiovascular: regular rate and rhythm Breasts: {Exam; breast:13139::"normal appearance, no masses or tenderness"} Abdomen: soft,  non-tender; non distended,  no masses,  no organomegaly Extremities: extremities normal, atraumatic, no cyanosis or edema Skin: Skin color, texture, turgor normal. No rashes or lesions Lymph nodes: Cervical, supraclavicular, and axillary nodes normal. No abnormal inguinal nodes palpated Neurologic: Grossly normal   Pelvic: External genitalia:  no lesions              Urethra:  normal appearing urethra with no masses, tenderness or lesions              Bartholins and Skenes: normal                 Vagina: normal appearing vagina with normal color and discharge, no lesions              Cervix: {CHL AMB PHY EX CERVIX NORM DEFAULT:[phone]::"no lesions"}               Bimanual Exam:  Uterus:  {CHL AMB PHY EX UTERUS NORM [phone]::"normal size, contour, position, consistency, mobility, non-tender"}              Adnexa: {CHL AMB PHY EX ADNEXA NO MASS DEFAULT:[id number]::"no mass, fullness, tenderness"}               Rectovaginal: Confirms               Anus:  normal sphincter tone, no lesions  *** chaperoned for the exam.  A:  Well Woman with normal exam  P:

## 2019-12-05 ENCOUNTER — Ambulatory Visit: Payer: BLUE CROSS/BLUE SHIELD | Admitting: Obstetrics and Gynecology

## 2019-12-26 NOTE — Progress Notes (Signed)
54 y.o. G65P3003 Married Black or [place] American Not Hispanic or Latino female here for annual exam.  Cycles are monthly, every 3-4 weeks. Bleeding for 3-4 days. Heavy x 2 days, can saturate a pad 4 x in a day.  Period Duration (Days): 4 Period Pattern: (!) Irregular Menstrual Flow: Heavy, Light Menstrual Control: Thin pad Menstrual Control Change Freq (Hours): 3 Dysmenorrhea: (!) Moderate Dysmenorrhea Symptoms: Cramping    Her 84 year old son had surgery for scoliosis earlier this year. Her Father died last fall.   Patient's last menstrual period was 12/28/2019.          Sexually active: Yes.    The current method of family planning is tubal ligation.    Exercising: Yes.    Walking Smoker:  no  Health Maintenance: Pap: 10-06-16 WNL NEG HR HPV  History of abnormal Pap:  no MMG:  07/25/19 Density C Bi-rads 1 neg  BMD:   Never  Colonoscopy: 10/2016 Normal per patient  TDaP:  2017  Gardasil: Na   reports that she has never smoked. She has never used smokeless tobacco. She reports that she does not drink alcohol and does not use drugs. Works as a Psychologist, counselling at [employer].  74 kids, 70 year old son (in [place]), [place] and 29 old son.   Past Medical History:  Diagnosis Date  . Anemia   . Hypertension   . Prediabetes 12/12/2018  . Prediabetes     Past Surgical History:  Procedure Laterality Date  . CESAREAN SECTION    . CHOLECYSTECTOMY  1998  . UMBILICAL HERNIA REPAIR  1999    Current Outpatient Medications  Medication Sig Dispense Refill  . iron polysaccharides (NIFEREX) 150 MG capsule Take 150 mg by mouth daily.    Marland Kitchen lisinopril-hydrochlorothiazide (PRINZIDE,ZESTORETIC) 20-25 MG per tablet Take 1 tablet by mouth daily.     No current facility-administered medications for this visit.    History reviewed. No pertinent family history.  Review of Systems  All other systems reviewed and are negative.   Exam:   BP 130/72   Pulse 62   Temp 98.1 F (36.7  C)   Ht 5' 1.5" (1.562 m)   Wt 171 lb (77.6 kg)   LMP 12/28/2019   SpO2 98%   BMI 31.79 kg/m   Weight change: @WEIGHTCHANGE @ Height:   Height: 5' 1.5" (156.2 cm)  Ht Readings from Last 3 Encounters:  12/30/19 5' 1.5" (1.562 m)  11/29/18 5\' 1"  (1.549 m)  10/12/17 5\' 1"  (1.549 m)    General appearance: alert, cooperative and appears stated age Head: Normocephalic, without obvious abnormality, atraumatic Neck: no adenopathy, supple, symmetrical, trachea midline and thyroid normal to inspection and palpation Lungs: clear to auscultation bilaterally Cardiovascular: regular rate and rhythm Breasts: normal appearance, no masses or tenderness Abdomen: soft, non-tender; non distended,  no masses,  no organomegaly Extremities: extremities normal, atraumatic, no cyanosis or edema Skin: Skin color, texture, turgor normal. No rashes or lesions Lymph nodes: Cervical, supraclavicular, and axillary nodes normal. No abnormal inguinal nodes palpated Neurologic: Grossly normal   Pelvic: External genitalia:  no lesions              Urethra:  normal appearing urethra with no masses, tenderness or lesions              Bartholins and Skenes: normal                 Vagina: normal appearing vagina with normal color  and discharge, no lesions              Cervix: no lesions               Bimanual Exam:  Uterus:  normal size, contour, position, consistency, mobility, non-tender              Adnexa: no mass, fullness, tenderness               Rectovaginal: Confirms               Anus:  normal sphincter tone, no lesions  Zenovia Jordan chaperoned for the exam.  A:  Well Woman with normal exam  H/O anemia  BMI 31  P:   Pap q 5 years  Screening labs, Ferritin, TSH, HgbA1C  Discussed breast self exam  Discussed calcium and vit D intake  Colonoscopy UTD  Mammogram UTD

## 2019-12-27 ENCOUNTER — Other Ambulatory Visit: Payer: Self-pay

## 2019-12-30 ENCOUNTER — Other Ambulatory Visit: Payer: Self-pay

## 2019-12-30 ENCOUNTER — Ambulatory Visit (INDEPENDENT_AMBULATORY_CARE_PROVIDER_SITE_OTHER): Payer: PRIVATE HEALTH INSURANCE | Admitting: Obstetrics and Gynecology

## 2019-12-30 ENCOUNTER — Encounter: Payer: Self-pay | Admitting: Obstetrics and Gynecology

## 2019-12-30 VITALS — BP 130/72 | HR 62 | Temp 98.1°F | Ht 61.5 in | Wt 171.0 lb

## 2019-12-30 DIAGNOSIS — Z862 Personal history of diseases of the blood and blood-forming organs and certain disorders involving the immune mechanism: Secondary | ICD-10-CM | POA: Diagnosis not present

## 2019-12-30 DIAGNOSIS — R7303 Prediabetes: Secondary | ICD-10-CM | POA: Diagnosis not present

## 2019-12-30 DIAGNOSIS — Z01419 Encounter for gynecological examination (general) (routine) without abnormal findings: Secondary | ICD-10-CM

## 2019-12-30 DIAGNOSIS — Z0000 Encounter for general adult medical examination without abnormal findings: Secondary | ICD-10-CM

## 2019-12-30 DIAGNOSIS — Z6831 Body mass index (BMI) 31.0-31.9, adult: Secondary | ICD-10-CM | POA: Diagnosis not present

## 2019-12-30 NOTE — Patient Instructions (Signed)
EXERCISE AND DIET:  We recommended that you start or continue a regular exercise program for good health. Regular exercise means any activity that makes your heart beat faster and makes you sweat.  We recommend exercising at least 30 minutes per day at least 3 days a week, preferably 4 or 5.  We also recommend a diet low in fat and sugar.  Inactivity, poor dietary choices and obesity can cause diabetes, heart attack, stroke, and kidney damage, among others.    ALCOHOL AND SMOKING:  Women should limit their alcohol intake to no more than 7 drinks/beers/glasses of wine (combined, not each!) per week. Moderation of alcohol intake to this level decreases your risk of breast cancer and liver damage. And of course, no recreational drugs are part of a healthy lifestyle.  And absolutely no smoking or even second hand smoke. Most people know smoking can cause heart and lung diseases, but did you know it also contributes to weakening of your bones? Aging of your skin?  Yellowing of your teeth and nails?  CALCIUM AND VITAMIN D:  Adequate intake of calcium and Vitamin D are recommended.  The recommendations for exact amounts of these supplements seem to change often, but generally speaking 1,000 mg of calcium (between diet and supplement) and 800 units of Vitamin D per day seems prudent. Certain women may benefit from higher intake of Vitamin D.  If you are among these women, your doctor will have told you during your visit.    PAP SMEARS:  Pap smears, to check for cervical cancer or precancers,  have traditionally been done yearly, although recent scientific advances have shown that most women can have pap smears less often.  However, every woman still should have a physical exam from her gynecologist every year. It will include a breast check, inspection of the vulva and vagina to check for abnormal growths or skin changes, a visual exam of the cervix, and then an exam to evaluate the size and shape of the uterus and  ovaries.  And after 54 years of age, a rectal exam is indicated to check for rectal cancers. We will also provide age appropriate advice regarding health maintenance, like when you should have certain vaccines, screening for sexually transmitted diseases, bone density testing, colonoscopy, mammograms, etc.   MAMMOGRAMS:  All women over 40 years old should have a yearly mammogram. Many facilities now offer a "3D" mammogram, which may cost around $50 extra out of pocket. If possible,  we recommend you accept the option to have the 3D mammogram performed.  It both reduces the number of women who will be called back for extra views which then turn out to be normal, and it is better than the routine mammogram at detecting truly abnormal areas.    COLON CANCER SCREENING: Now recommend starting at age 45. At this time colonoscopy is not covered for routine screening until 50. There are take home tests that can be done between 45-49.   COLONOSCOPY:  Colonoscopy to screen for colon cancer is recommended for all women at age 50.  We know, you hate the idea of the prep.  We agree, BUT, having colon cancer and not knowing it is worse!!  Colon cancer so often starts as a polyp that can be seen and removed at colonscopy, which can quite literally save your life!  And if your first colonoscopy is normal and you have no family history of colon cancer, most women don't have to have it again for   10 years.  Once every ten years, you can do something that may end up saving your life, right?  We will be happy to help you get it scheduled when you are ready.  Be sure to check your insurance coverage so you understand how much it will cost.  It may be covered as a preventative service at no cost, but you should check your particular policy.      Breast Self-Awareness Breast self-awareness means being familiar with how your breasts look and feel. It involves checking your breasts regularly and reporting any changes to your  health care provider. Practicing breast self-awareness is important. A change in your breasts can be a sign of a serious medical problem. Being familiar with how your breasts look and feel allows you to find any problems early, when treatment is more likely to be successful. All women should practice breast self-awareness, including women who have had breast implants. How to do a breast self-exam One way to learn what is normal for your breasts and whether your breasts are changing is to do a breast self-exam. To do a breast self-exam: Look for Changes  1. Remove all the clothing above your waist. 2. Stand in front of a mirror in a room with good lighting. 3. Put your hands on your hips. 4. Push your hands firmly downward. 5. Compare your breasts in the mirror. Look for differences between them (asymmetry), such as: ? Differences in shape. ? Differences in size. ? Puckers, dips, and bumps in one breast and not the other. 6. Look at each breast for changes in your skin, such as: ? Redness. ? Scaly areas. 7. Look for changes in your nipples, such as: ? Discharge. ? Bleeding. ? Dimpling. ? Redness. ? A change in position. Feel for Changes Carefully feel your breasts for lumps and changes. It is best to do this while lying on your back on the floor and again while sitting or standing in the shower or tub with soapy water on your skin. Feel each breast in the following way:  Place the arm on the side of the breast you are examining above your head.  Feel your breast with the other hand.  Start in the nipple area and make  inch (2 cm) overlapping circles to feel your breast. Use the pads of your three middle fingers to do this. Apply light pressure, then medium pressure, then firm pressure. The light pressure will allow you to feel the tissue closest to the skin. The medium pressure will allow you to feel the tissue that is a little deeper. The firm pressure will allow you to feel the tissue  close to the ribs.  Continue the overlapping circles, moving downward over the breast until you feel your ribs below your breast.  Move one finger-width toward the center of the body. Continue to use the  inch (2 cm) overlapping circles to feel your breast as you move slowly up toward your collarbone.  Continue the up and down exam using all three pressures until you reach your armpit.  Write Down What You Find  Write down what is normal for each breast and any changes that you find. Keep a written record with breast changes or normal findings for each breast. By writing this information down, you do not need to depend only on memory for size, tenderness, or location. Write down where you are in your menstrual cycle, if you are still menstruating. If you are having trouble noticing differences   in your breasts, do not get discouraged. With time you will become more familiar with the variations in your breasts and more comfortable with the exam. How often should I examine my breasts? Examine your breasts every month. If you are breastfeeding, the best time to examine your breasts is after a feeding or after using a breast pump. If you menstruate, the best time to examine your breasts is 5-7 days after your period is over. During your period, your breasts are lumpier, and it may be more difficult to notice changes. When should I see my health care provider? See your health care provider if you notice:  A change in shape or size of your breasts or nipples.  A change in the skin of your breast or nipples, such as a reddened or scaly area.  Unusual discharge from your nipples.  A lump or thick area that was not there before.  Pain in your breasts.  Anything that concerns you.  

## 2019-12-31 ENCOUNTER — Other Ambulatory Visit: Payer: Self-pay | Admitting: Obstetrics and Gynecology

## 2019-12-31 DIAGNOSIS — D509 Iron deficiency anemia, unspecified: Secondary | ICD-10-CM

## 2019-12-31 LAB — TSH: TSH: 0.857 u[IU]/mL (ref 0.450–4.500)

## 2019-12-31 LAB — COMPREHENSIVE METABOLIC PANEL
ALT: 15 IU/L (ref 0–32)
AST: 14 IU/L (ref 0–40)
Albumin/Globulin Ratio: 1.3 (ref 1.2–2.2)
Albumin: 4.1 g/dL (ref 3.8–4.9)
Alkaline Phosphatase: 77 IU/L (ref 48–121)
BUN/Creatinine Ratio: 18 (ref 9–23)
BUN: 13 mg/dL (ref 6–24)
Bilirubin Total: 0.4 mg/dL (ref 0.0–1.2)
CO2: 22 mmol/L (ref 20–29)
Calcium: 9.3 mg/dL (ref 8.7–10.2)
Chloride: 106 mmol/L (ref 96–106)
Creatinine, Ser: 0.73 mg/dL (ref 0.57–1.00)
GFR calc Af Amer: 109 mL/min/{1.73_m2} (ref >59)
GFR calc non Af Amer: 94 mL/min/{1.73_m2} (ref >59)
Globulin, Total: 3.1 g/dL (ref 1.5–4.5)
Glucose: 99 mg/dL (ref 65–99)
Potassium: 3.6 mmol/L (ref 3.5–5.2)
Sodium: 142 mmol/L (ref 134–144)
Total Protein: 7.2 g/dL (ref 6.0–8.5)

## 2019-12-31 LAB — CBC
Hematocrit: 35.4 % (ref 34.0–46.6)
Hemoglobin: 10.6 g/dL — ABNORMAL LOW (ref 11.1–15.9)
MCH: 21.7 pg — ABNORMAL LOW (ref 26.6–33.0)
MCHC: 29.9 g/dL — ABNORMAL LOW (ref 31.5–35.7)
MCV: 73 fL — ABNORMAL LOW (ref 79–97)
Platelets: 264 10*3/uL (ref 150–450)
RBC: 4.88 x10E6/uL (ref 3.77–5.28)
RDW: 16 % — ABNORMAL HIGH (ref 11.7–15.4)
WBC: 4.9 10*3/uL (ref 3.4–10.8)

## 2019-12-31 LAB — LIPID PANEL
Chol/HDL Ratio: 3.5 ratio (ref 0.0–4.4)
Cholesterol, Total: 179 mg/dL (ref 100–199)
HDL: 51 mg/dL (ref >39)
LDL Chol Calc (NIH): 114 mg/dL — ABNORMAL HIGH (ref 0–99)
Triglycerides: 75 mg/dL (ref 0–149)
VLDL Cholesterol Cal: 14 mg/dL (ref 5–40)

## 2019-12-31 LAB — FERRITIN: Ferritin: 77 ng/mL (ref 15–150)

## 2019-12-31 LAB — HEMOGLOBIN A1C
Est. average glucose Bld gHb Est-mCnc: 128 mg/dL
Hgb A1c MFr Bld: 6.1 % — ABNORMAL HIGH (ref 4.8–5.6)

## 2020-01-03 ENCOUNTER — Other Ambulatory Visit (INDEPENDENT_AMBULATORY_CARE_PROVIDER_SITE_OTHER): Payer: PRIVATE HEALTH INSURANCE

## 2020-01-03 ENCOUNTER — Other Ambulatory Visit: Payer: Self-pay

## 2020-01-03 DIAGNOSIS — D509 Iron deficiency anemia, unspecified: Secondary | ICD-10-CM

## 2020-01-07 LAB — HGB FRACTIONATION CASCADE
Hgb A2: 2.3 % (ref 1.8–3.2)
Hgb A: 97.7 % (ref 96.4–98.8)
Hgb F: 0 % (ref 0.0–2.0)
Hgb S: 0 %

## 2020-09-15 ENCOUNTER — Other Ambulatory Visit: Payer: Self-pay | Admitting: Obstetrics and Gynecology

## 2020-12-27 IMAGING — MG DIGITAL SCREENING BILAT W/ CAD
4 series · 4 of 4 positions shown · non-contrast
Comparison: Previous exam(s).

CLINICAL DATA: Screening.

EXAM:
DIGITAL SCREENING BILATERAL MAMMOGRAM WITH CAD

[L MLO]
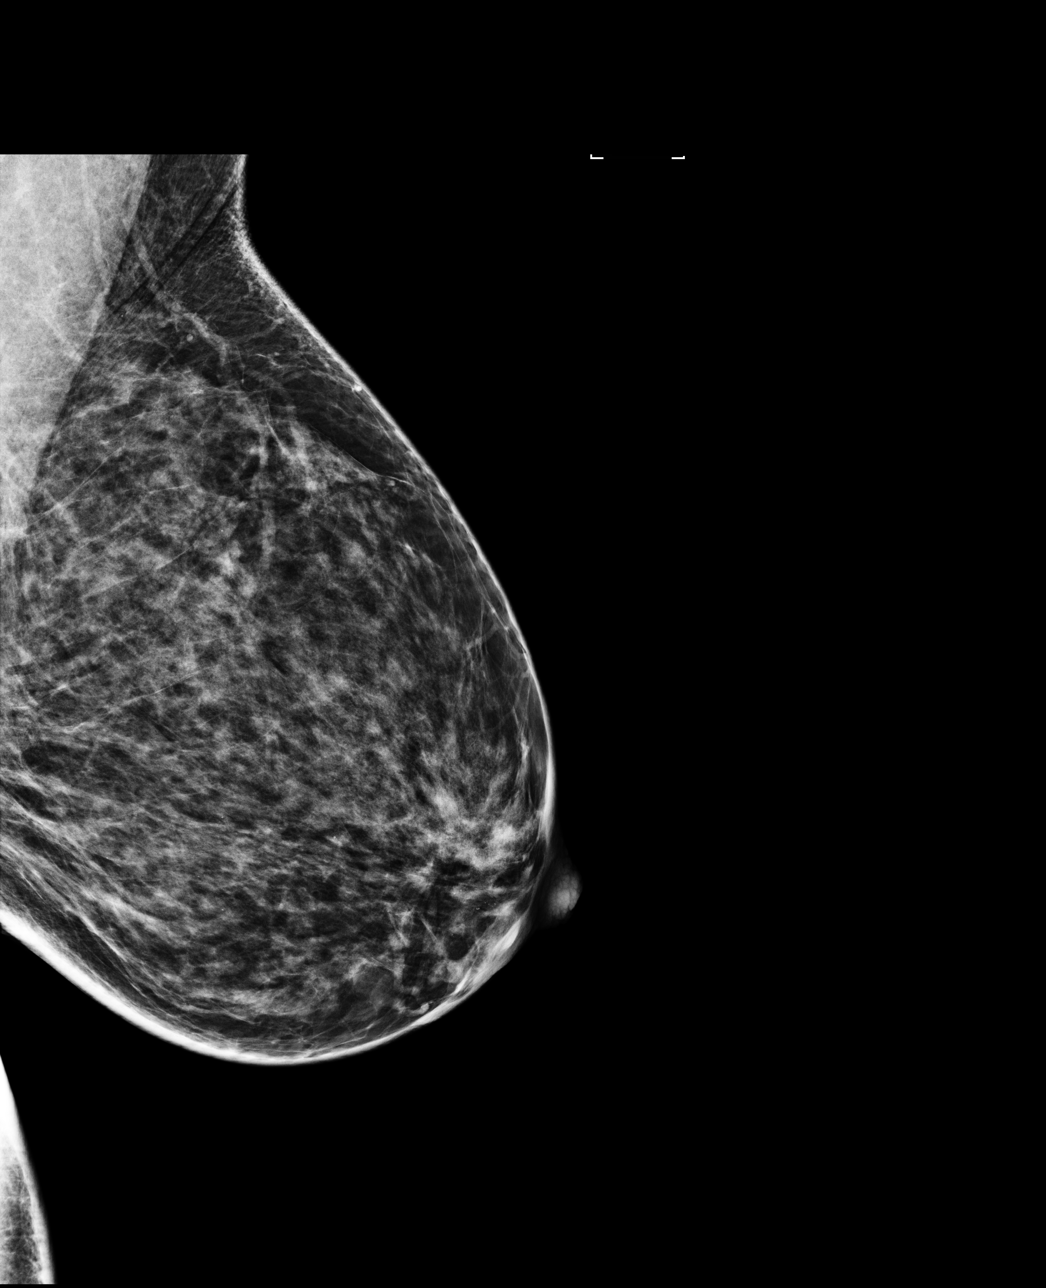

[R CC]
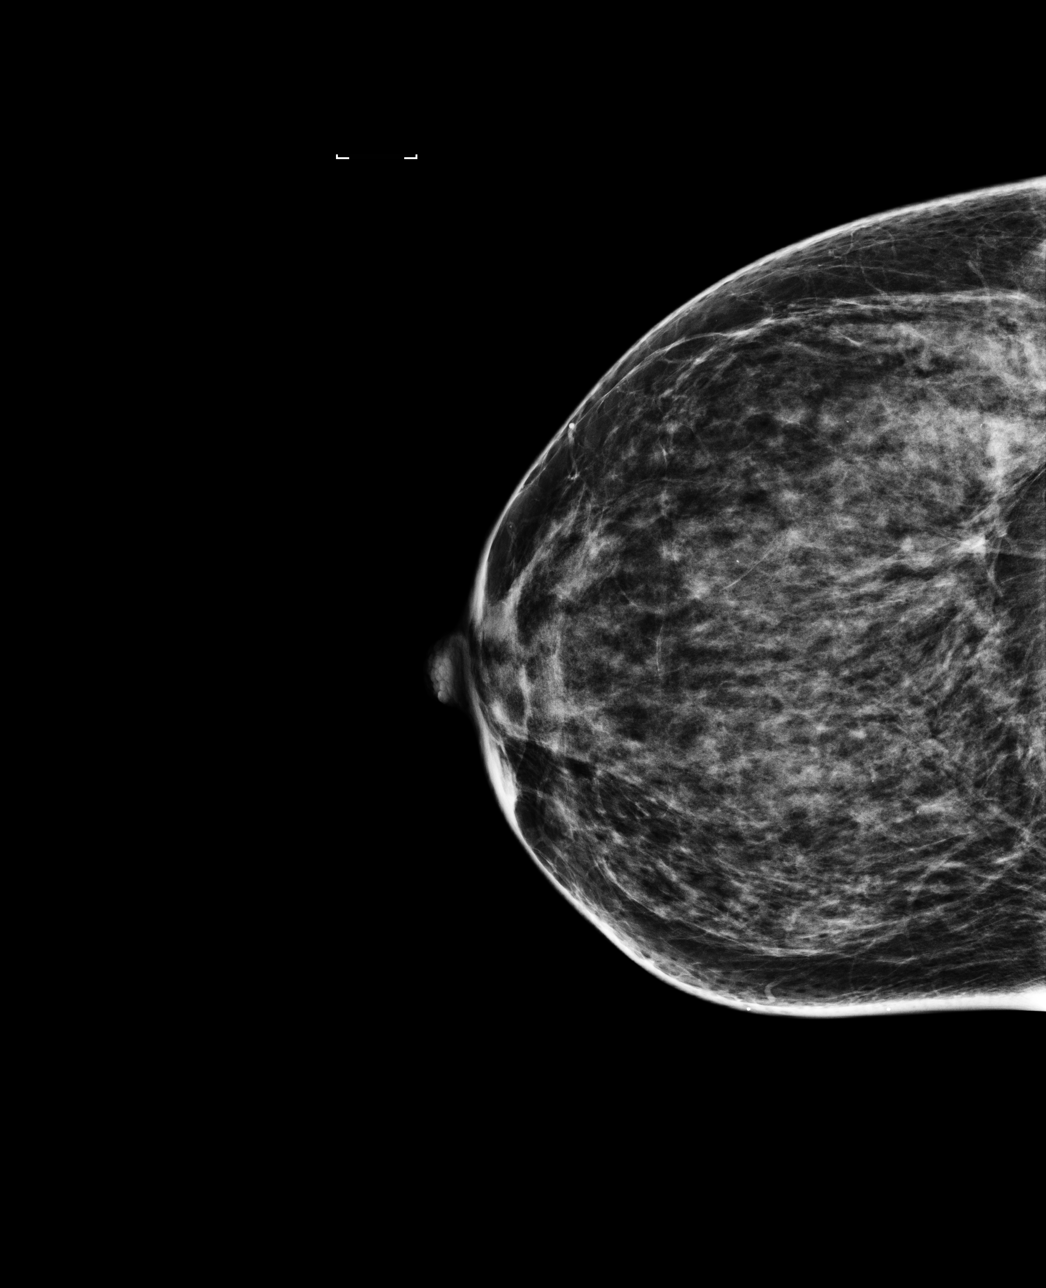

[L CC]
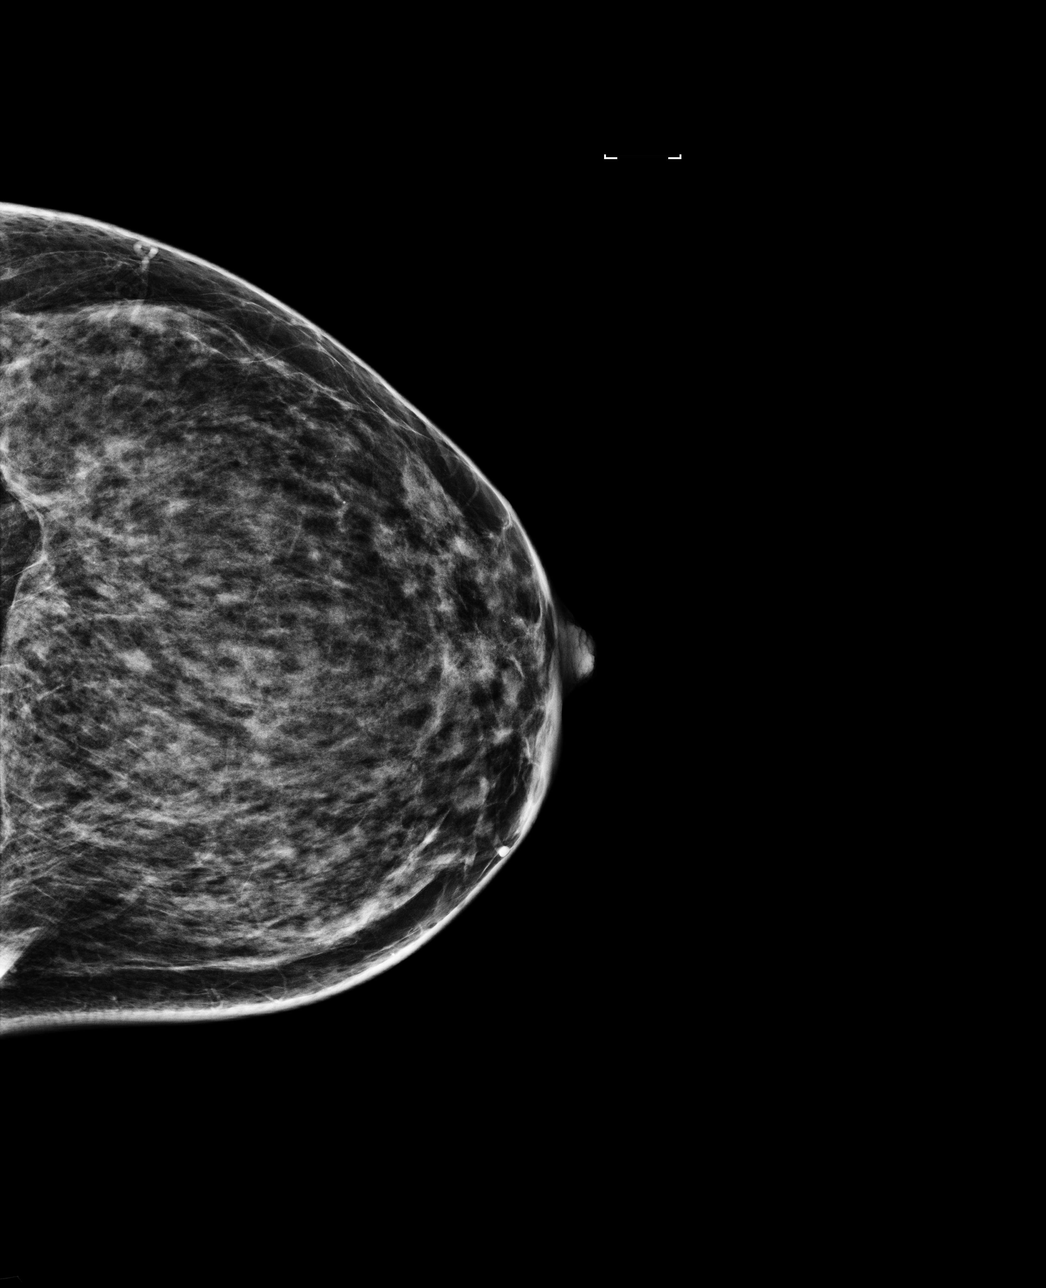

[R MLO]
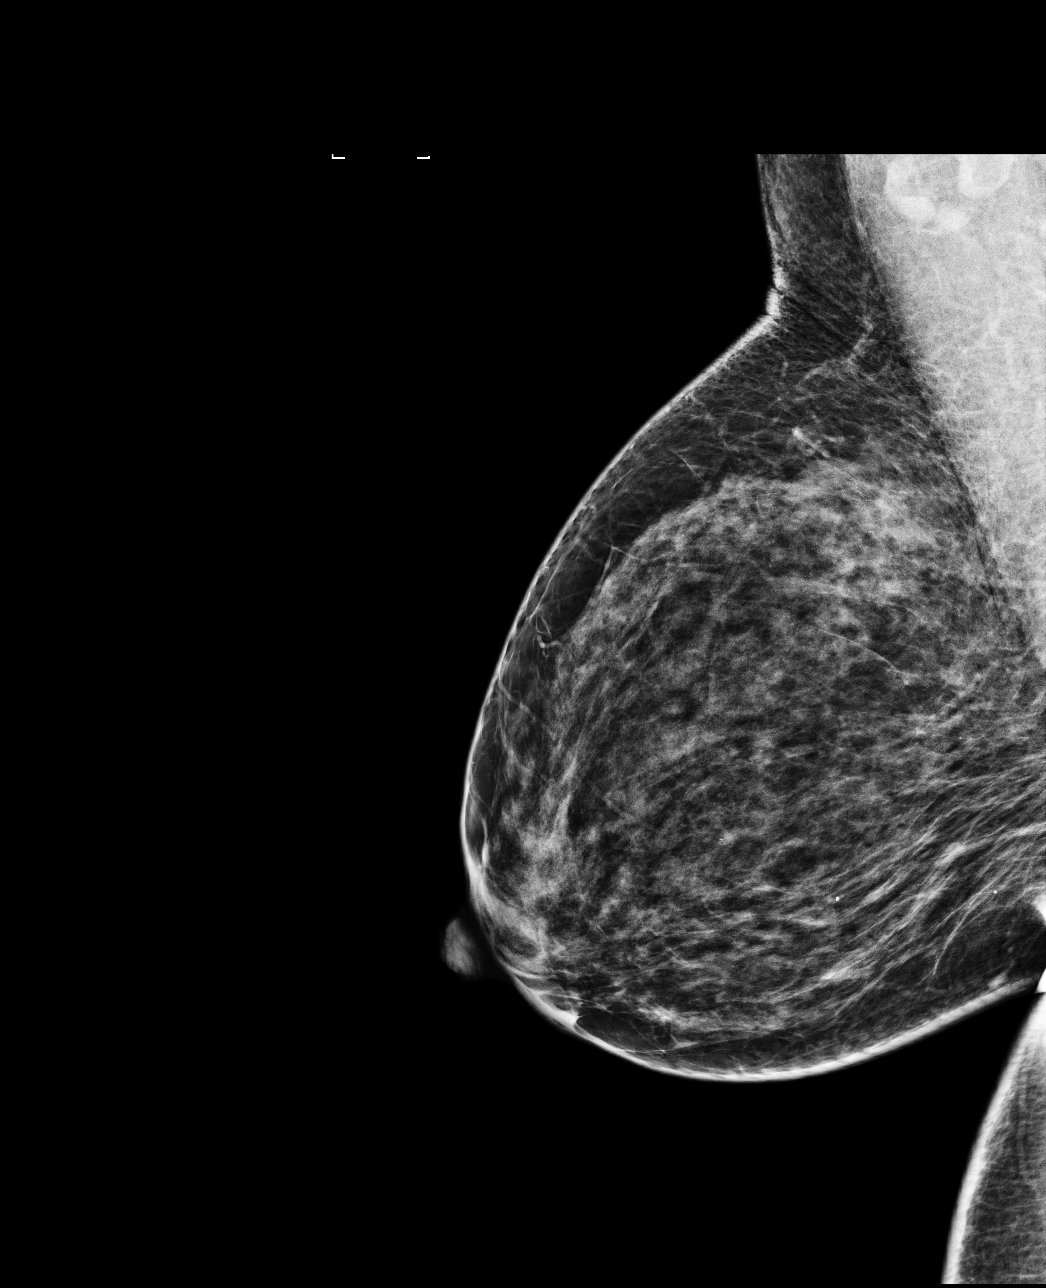

[4 of 4 positions shown; findings below may reference images not displayed]

ACR Breast Density Category c: The breast tissue is heterogeneously
dense, which may obscure small masses.
FINDINGS: There are no findings suspicious for malignancy. Images were
processed with CAD.
IMPRESSION: No mammographic evidence of malignancy. A result letter of this
screening mammogram will be mailed directly to the patient.

RECOMMENDATION:
Screening mammogram in one year. (Code:YJ-[id number])

BI-RADS CATEGORY  1: Negative.

## 2021-01-06 ENCOUNTER — Other Ambulatory Visit: Payer: Self-pay

## 2021-01-06 ENCOUNTER — Encounter: Payer: Self-pay | Admitting: Obstetrics and Gynecology

## 2021-01-06 ENCOUNTER — Ambulatory Visit (INDEPENDENT_AMBULATORY_CARE_PROVIDER_SITE_OTHER): Payer: PRIVATE HEALTH INSURANCE | Admitting: Obstetrics and Gynecology

## 2021-01-06 VITALS — BP 132/74 | HR 62 | Ht 61.0 in | Wt 171.0 lb

## 2021-01-06 DIAGNOSIS — Z01419 Encounter for gynecological examination (general) (routine) without abnormal findings: Secondary | ICD-10-CM

## 2021-01-06 DIAGNOSIS — R7303 Prediabetes: Secondary | ICD-10-CM | POA: Diagnosis not present

## 2021-01-06 DIAGNOSIS — Z6832 Body mass index (BMI) 32.0-32.9, adult: Secondary | ICD-10-CM

## 2021-01-06 DIAGNOSIS — Z862 Personal history of diseases of the blood and blood-forming organs and certain disorders involving the immune mechanism: Secondary | ICD-10-CM | POA: Diagnosis not present

## 2021-01-06 NOTE — Progress Notes (Signed)
55 y.o. G1P3003 Married Black or African American Not Hispanic or Latino female here for annual exam.   Period Cycle (Days): 28 Period Duration (Days): 5 Period Pattern: Regular Menstrual Flow: Heavy Menstrual Control: Maxi pad Menstrual Control Change Freq (Hours): 2 Dysmenorrhea: (!) Moderate Dysmenorrhea Symptoms: Cramping She is changing her pad in 2-3 hours, not completely saturated.  No vasomotor symptoms, no dyspareunia.   H/O anemia, negative hgb electrophoresis, normal ferritin. Hgb last year was 10.6 (microcytic).  H/O prediabetes, HgbA1C last year was 6.1%  Patient's last menstrual period was 01/05/2021.          Sexually active: Yes.    The current method of family planning is tubal ligation.    Exercising: Yes.     Walking  Smoker: no  Health Maintenance: Pap:  10-06-16 WNL NEG HR HPV   History of abnormal Pap:  no MMG:  07/25/19 density C Bi-rads 1 neg  BMD:   none  Colonoscopy: 10/2016 Normal per patient  TDaP:  2017 Gardasil: NA   reports that she has never smoked. She has never used smokeless tobacco. She reports that she does not drink alcohol and does not use drugs. She works as a Agricultural engineer at [employer]. She has 3 sones. Her 55 year old is in [place]. Other sons are 77 and 64 (will be senior and freshman in college).   Past Medical History:  Diagnosis Date   Anemia    Hypertension    Prediabetes 12/12/2018   Prediabetes     Past Surgical History:  Procedure Laterality Date   CESAREAN SECTION     CHOLECYSTECTOMY  1998   UMBILICAL HERNIA REPAIR  1999    Current Outpatient Medications  Medication Sig Dispense Refill   iron polysaccharides (NIFEREX) 150 MG capsule Take 150 mg by mouth daily.     No current facility-administered medications for this visit.    History reviewed. No pertinent family history.  Review of Systems  All other systems reviewed and are negative.  Exam:   BP 132/74   Pulse 62   Ht 5\' 1"  (1.549 m)   Wt  171 lb (77.6 kg)   LMP 01/05/2021   SpO2 100%   BMI 32.31 kg/m   Weight change: @WEIGHTCHANGE @ Height:   Height: 5\' 1"  (154.9 cm)  Ht Readings from Last 3 Encounters:  01/06/21 5\' 1"  (1.549 m)  12/30/19 5' 1.5" (1.562 m)  11/29/18 5\' 1"  (1.549 m)    General appearance: alert, cooperative and appears stated age Head: Normocephalic, without obvious abnormality, atraumatic Neck: no adenopathy, supple, symmetrical, trachea midline and thyroid normal to inspection and palpation Lungs: clear to auscultation bilaterally Cardiovascular: regular rate and rhythm Breasts: normal appearance, no masses or tenderness Abdomen: soft, non-tender; non distended,  no masses,  no organomegaly Extremities: extremities normal, atraumatic, no cyanosis or edema Skin: Skin color, texture, turgor normal. No rashes or lesions Lymph nodes: Cervical, supraclavicular, and axillary nodes normal. No abnormal inguinal nodes palpated Neurologic: Grossly normal   Pelvic: External genitalia:  no lesions              Urethra:  normal appearing urethra with no masses, tenderness or lesions              Bartholins and Skenes: normal                 Vagina: normal appearing vagina with normal color and discharge, no lesions  Cervix: no lesions               Bimanual Exam:  Uterus:  normal size, contour, position, consistency, mobility, non-tender and anteverted              Adnexa: no mass, fullness, tenderness               Rectovaginal: Confirms               Anus:  normal sphincter tone, no lesions  Carolynn Serve chaperoned for the exam.  1. Well woman exam Pap next year Mammogram due, she is trying to schedule (issues with insurance) Discussed breast self exam Discussed calcium and vit D intake Labs with primary (being seen later this week at [facility])  2. History of anemia Prior normal ferritin and negative evaluation for thalassemia   3. Prediabetes Will f/u with primary  4. BMI  32.0-32.9,adult Trying to eat healthy and exercise.

## 2021-01-06 NOTE — Patient Instructions (Signed)
EXERCISE   We recommended that you start or continue a regular exercise program for good health. Physical activity is anything that gets your body moving, some is better than none. The CDC recommends 150 minutes per week of Moderate-Intensity Aerobic Activity and 2 or more days of Muscle Strengthening Activity.  Benefits of exercise are limitless: helps weight loss/weight maintenance, improves mood and energy, helps with depression and anxiety, improves sleep, tones and strengthens muscles, improves balance, improves bone density, protects from chronic conditions such as heart disease, high blood pressure and diabetes and so much more. To learn more visit: https://www.cdc.gov/physicalactivity/index.html  DIET: Good nutrition starts with a healthy diet of fruits, vegetables, whole grains, and lean protein sources. Drink plenty of water for hydration. Minimize empty calories, sodium, sweets. For more information about dietary recommendations visit: https://health.gov/our-work/nutrition-physical-activity/dietary-guidelines and https://www.myplate.gov/  ALCOHOL:  Women should limit their alcohol intake to no more than 7 drinks/beers/glasses of wine (combined, not each!) per week. Moderation of alcohol intake to this level decreases your risk of breast cancer and liver damage.  If you are concerned that you may have a problem, or your friends have told you they are concerned about your drinking, there are many resources to help. A well-known program that is free, effective, and available to all people all over the nation is Alcoholics Anonymous.  Check out this site to learn more: https://www.aa.org/   CALCIUM AND VITAMIN D:  Adequate intake of calcium and Vitamin D are recommended for bone health.  You should be getting between 1000-1200 mg of calcium and 800 units of Vitamin D daily between diet and supplements  PAP SMEARS:  Pap smears, to check for cervical cancer or precancers,  have traditionally been  done yearly, scientific advances have shown that most women can have pap smears less often.  However, every woman still should have a physical exam from her gynecologist every year. It will include a breast check, inspection of the vulva and vagina to check for abnormal growths or skin changes, a visual exam of the cervix, and then an exam to evaluate the size and shape of the uterus and ovaries. We will also provide age appropriate advice regarding health maintenance, like when you should have certain vaccines, screening for sexually transmitted diseases, bone density testing, colonoscopy, mammograms, etc.   MAMMOGRAMS:  All women over 40 years old should have a routine mammogram.   COLON CANCER SCREENING: Now recommend starting at age 45. At this time colonoscopy is not covered for routine screening until 50. There are take home tests that can be done between 45-49.   COLONOSCOPY:  Colonoscopy to screen for colon cancer is recommended for all women at age 50.  We know, you hate the idea of the prep.  We agree, BUT, having colon cancer and not knowing it is worse!!  Colon cancer so often starts as a polyp that can be seen and removed at colonscopy, which can quite literally save your life!  And if your first colonoscopy is normal and you have no family history of colon cancer, most women don't have to have it again for 10 years.  Once every ten years, you can do something that may end up saving your life, right?  We will be happy to help you get it scheduled when you are ready.  Be sure to check your insurance coverage so you understand how much it will cost.  It may be covered as a preventative service at no cost, but you should check   your particular policy.      Breast Self-Awareness Breast self-awareness means being familiar with how your breasts look and feel. It involves checking your breasts regularly and reporting any changes to your health care provider. Practicing breast self-awareness is  important. A change in your breasts can be a sign of a serious medical problem. Being familiar with how your breasts look and feel allows you to find any problems early, when treatment is more likely to be successful. All women should practice breast self-awareness, including women who have had breast implants. How to do a breast self-exam One way to learn what is normal for your breasts and whether your breasts are changing is to do a breast self-exam. To do a breast self-exam: Look for Changes  Remove all the clothing above your waist. Stand in front of a mirror in a room with good lighting. Put your hands on your hips. Push your hands firmly downward. Compare your breasts in the mirror. Look for differences between them (asymmetry), such as: Differences in shape. Differences in size. Puckers, dips, and bumps in one breast and not the other. Look at each breast for changes in your skin, such as: Redness. Scaly areas. Look for changes in your nipples, such as: Discharge. Bleeding. Dimpling. Redness. A change in position. Feel for Changes Carefully feel your breasts for lumps and changes. It is best to do this while lying on your back on the floor and again while sitting or standing in the shower or tub with soapy water on your skin. Feel each breast in the following way: Place the arm on the side of the breast you are examining above your head. Feel your breast with the other hand. Start in the nipple area and make  inch (2 cm) overlapping circles to feel your breast. Use the pads of your three middle fingers to do this. Apply light pressure, then medium pressure, then firm pressure. The light pressure will allow you to feel the tissue closest to the skin. The medium pressure will allow you to feel the tissue that is a little deeper. The firm pressure will allow you to feel the tissue close to the ribs. Continue the overlapping circles, moving downward over the breast until you feel your  ribs below your breast. Move one finger-width toward the center of the body. Continue to use the  inch (2 cm) overlapping circles to feel your breast as you move slowly up toward your collarbone. Continue the up and down exam using all three pressures until you reach your armpit.  Write Down What You Find  Write down what is normal for each breast and any changes that you find. Keep a written record with breast changes or normal findings for each breast. By writing this information down, you do not need to depend only on memory for size, tenderness, or location. Write down where you are in your menstrual cycle, if you are still menstruating. If you are having trouble noticing differences in your breasts, do not get discouraged. With time you will become more familiar with the variations in your breasts and more comfortable with the exam. How often should I examine my breasts? Examine your breasts every month. If you are breastfeeding, the best time to examine your breasts is after a feeding or after using a breast pump. If you menstruate, the best time to examine your breasts is 5-7 days after your period is over. During your period, your breasts are lumpier, and it may be more   difficult to notice changes. When should I see my health care provider? See your health care provider if you notice: A change in shape or size of your breasts or nipples. A change in the skin of your breast or nipples, such as a reddened or scaly area. Unusual discharge from your nipples. A lump or thick area that was not there before. Pain in your breasts. Anything that concerns you. Williams Textbook of Endocrinology (14th ed., pp. 574-641). [place], [place]: Elsevier.">  Perimenopause Perimenopause is the normal time of a woman's life when the levels of estrogen, the female hormone produced by the ovaries, begin to decrease. This leads to changes in menstrual periods before they stop completely (menopause).  Perimenopause can begin 2-8 years before menopause. During perimenopause,the ovaries may or may not produce an egg and a woman can still become pregnant. What are the causes? This condition is caused by a natural change in hormone levels that happens asyou get older. What increases the risk? This condition is more likely to start at an earlier age if you have certain medical conditions or have undergone treatments, including: A tumor of the pituitary gland in the brain. A disease that affects the ovaries and hormone production. Certain cancer treatments, such as chemotherapy or hormone therapy, or radiation therapy on the pelvis. Heavy smoking and excessive alcohol use. Family history of early menopause. What are the signs or symptoms? Perimenopausal changes affect each woman differently. Symptoms of this condition may include: Hot flashes. Irregular menstrual periods. Night sweats. Changes in feelings about sex. This could be a decrease in sex drive or an increased discomfort around your sexuality. Vaginal dryness. Headaches. Mood swings. Depression. Problems sleeping (insomnia). Memory problems or trouble concentrating. Irritability. Tiredness. Weight gain. Anxiety. Trouble getting pregnant. How is this diagnosed? This condition is diagnosed based on your medical history, a physical exam, your age, your menstrual history, and your symptoms. Hormone tests may also bedone. How is this treated? In some cases, no treatment is needed. You and your health care provider should make a decision together about whether treatment is necessary. Treatment will be based on your individual condition and preferences. Various treatments are available, such as: Menopausal hormone therapy (MHT). Medicines to treat specific symptoms. Acupuncture. Vitamin or herbal supplements. Before starting treatment, make sure to let your health care provider know if you have a personal or family history  of: Heart disease. Breast cancer. Blood clots. Diabetes. Osteoporosis. Follow these instructions at home: Medicines Take over-the-counter and prescription medicines only as told by your health care provider. Take vitamin supplements only as told by your health care provider. Talk with your health care provider before starting any herbal supplements. Lifestyle  Do not use any products that contain nicotine or tobacco, such as cigarettes, e-cigarettes, and chewing tobacco. If you need help quitting, ask your health care provider. Get at least 30 minutes of physical activity on 5 or more days each week. Eat a balanced diet that includes fresh fruits and vegetables, whole grains, soybeans, eggs, lean meat, and low-fat dairy. Avoid alcoholic and caffeinated beverages, as well as spicy foods. This may help prevent hot flashes. Get 7-8 hours of sleep each night. Dress in layers that can be removed to help you manage hot flashes. Find ways to manage stress, such as deep breathing, meditation, or journaling.  General instructions  Keep track of your menstrual periods, including: When they occur. How heavy they are and how long they last. How much time passes between periods. Keep   track of your symptoms, noting when they start, how often you have them, and how long they last. Use vaginal lubricants or moisturizers to help with vaginal dryness and improve comfort during sex. You can still become pregnant if you are having irregular periods. Make sure you use contraception during perimenopause if you do not want to get pregnant. Keep all follow-up visits. This is important. This includes any group therapy or counseling.  Contact a health care provider if: You have heavy vaginal bleeding or pass blood clots. Your period lasts more than 2 days longer than normal. Your periods are recurring sooner than 21 days. You bleed after having sex. You have pain during sex. Get help right away if you  have: Chest pain, trouble breathing, or trouble talking. Severe depression. Pain when you urinate. Severe headaches. Vision problems. Summary Perimenopause is the time when a woman's body begins to move into menopause. This may happen naturally or as a result of other health problems or medical treatments. Perimenopause can begin 2-8 years before menopause, and it can last for several years. Perimenopausal symptoms can be managed through medicines, lifestyle changes, and complementary therapies such as acupuncture. This information is not intended to replace advice given to you by your health care provider. Make sure you discuss any questions you have with your healthcare provider. Document Revised: 12/12/2019 Document Reviewed: 12/12/2019 Elsevier Patient Education  2022 Elsevier Inc.  

## 2021-01-21 LAB — EXTERNAL GENERIC LAB PROCEDURE: COLOGUARD: NEGATIVE

## 2022-10-31 ENCOUNTER — Encounter (HOSPITAL_COMMUNITY): Payer: Self-pay

## 2022-10-31 ENCOUNTER — Ambulatory Visit (HOSPITAL_COMMUNITY)
Admission: EM | Admit: 2022-10-31 | Discharge: 2022-10-31 | Disposition: A | Discharge status: Home / Self Care | Payer: PRIVATE HEALTH INSURANCE

## 2022-10-31 DIAGNOSIS — I10 Essential (primary) hypertension: Secondary | ICD-10-CM | POA: Diagnosis not present

## 2022-10-31 DIAGNOSIS — T464X5A Adverse effect of angiotensin-converting-enzyme inhibitors, initial encounter: Secondary | ICD-10-CM

## 2022-10-31 DIAGNOSIS — T783XXA Angioneurotic edema, initial encounter: Secondary | ICD-10-CM | POA: Diagnosis not present

## 2022-10-31 MED ORDER — DEXAMETHASONE SODIUM PHOSPHATE 10 MG/ML IJ SOLN
INTRAMUSCULAR | Status: AC
  Filled 2022-10-31: qty 1

## 2022-10-31 MED ORDER — DEXAMETHASONE SODIUM PHOSPHATE 10 MG/ML IJ SOLN
10.0000 mg | Freq: Once | INTRAMUSCULAR | Status: AC
  Administered 2022-10-31: 10 mg via INTRAMUSCULAR

## 2022-10-31 NOTE — ED Provider Notes (Signed)
[facility]    CSN: [id number] Arrival date & time: 10/31/22  0802      History   Chief Complaint Chief Complaint  Patient presents with   Oral Swelling    HPI Carla [name] is a 57 y.o. female.   Patient presents to urgent care for evaluation of upper lip swelling that she first noticed this morning upon waking at approximately 4 AM.  She states she ate some ice cream last night and started noticing slight tingling to the upper lip without any swelling.  This morning, her upper lip is very swollen and nontender.  No warmth or itching.  Denies sensation of throat closure, throat itching, chest pain, shortness of breath, dizziness, ear pain, dental pain, headache, and recent trauma/injury to the upper lip.  She has eaten this type of ice cream in the past and does not believe this to be an allergic reaction.  Able to swallow and maintain secretions without drooling.  No muffled voice sounds.  No recent antibiotic or steroid use.  She does take lisinopril-hydrochlorothiazide and has taken this medication for many years.  No recent dose changes to her lisinopril-hydrochlorothiazide.  She has not attempted use of any over-the-counter medications or remedies to help with swelling before coming to urgent care.  She has a primary care provider and plans on scheduling an appointment with them soon to discuss medication regimen.  She is hesitant to continue taking blood pressure medications due to side effects.  Blood pressure currently stable at 153/96.  She has not had her lisinopril hydrochlorothiazide this morning but did take it yesterday.     Past Medical History:  Diagnosis Date   Anemia    Hypertension    Prediabetes 12/12/2018   Prediabetes     Patient Active Problem List   Diagnosis Date Noted   Prediabetes 12/12/2018   Essential hypertension, benign 08/28/2013    Past Surgical History:  Procedure Laterality Date   CESAREAN SECTION     CHOLECYSTECTOMY  1998    UMBILICAL HERNIA REPAIR  1999    OB History     Gravida  3   Para  3   Term  3   Preterm  0   AB  0   Living  3      SAB  0   IAB  0   Ectopic  0   Multiple  0   Live Births  3            Home Medications    Prior to Admission medications   Medication Sig Start Date End Date Taking? Authorizing Provider  iron polysaccharides (NIFEREX) 150 MG capsule Take 150 mg by mouth daily.   Yes [provider]  lisinopril-hydrochlorothiazide (ZESTORETIC) 20-25 MG tablet Take 1 tablet by mouth daily.   Yes [provider]    Family History History reviewed. No pertinent family history.  Social History Social History   Tobacco Use   Smoking status: Never   Smokeless tobacco: Never  Vaping Use   Vaping Use: Never used  Substance Use Topics   Alcohol use: No   Drug use: No     Allergies   Patient has no known allergies.   Review of Systems Review of Systems Per HPI  Physical Exam Triage Vital Signs ED Triage Vitals [10/31/22 0822]  Enc Vitals Group     BP (!) 153/96     Pulse Rate 61     Resp 16  Temp 98.5 F (36.9 C)     Temp Source Oral     SpO2 98 %     Weight      Height      Head Circumference      Peak Flow      Pain Score      Pain Loc      Pain Edu?      Excl. in GC?    No data found.  Updated Vital Signs BP (!) 153/96 (BP Location: Left Arm)   Pulse 61   Temp 98.5 F (36.9 C) (Oral)   Resp 16   SpO2 98%   Visual Acuity Right Eye Distance:   Left Eye Distance:   Bilateral Distance:    Right Eye Near:   Left Eye Near:    Bilateral Near:     Physical Exam Vitals and nursing note reviewed.  Constitutional:      Appearance: She is not ill-appearing or toxic-appearing.  HENT:     Head: Normocephalic and atraumatic.     Right Ear: Hearing, tympanic membrane, ear canal and external ear normal.     Left Ear: Hearing, tympanic membrane, ear canal and external ear normal.     Nose: Nose normal.      Mouth/Throat:     Lips: Pink.     Mouth: Mucous membranes are moist. Angioedema (Significant angioedema to the upper lip as seen in image below) present. No injury.     Tongue: No lesions. Tongue does not deviate from midline.     Palate: No mass and lesions.     Pharynx: Oropharynx is clear. Uvula midline. No pharyngeal swelling, oropharyngeal exudate, posterior oropharyngeal erythema or uvula swelling.     Tonsils: No tonsillar exudate or tonsillar abscesses.     Comments: Maintaining secretions without drooling, changes in phonation, or difficulty swallowing.  Airway intact. Eyes:     General: Lids are normal. Vision grossly intact. Gaze aligned appropriately.     Extraocular Movements: Extraocular movements intact.     Conjunctiva/sclera: Conjunctivae normal.  Cardiovascular:     Rate and Rhythm: Normal rate and regular rhythm.     Heart sounds: Normal heart sounds, S1 normal and S2 normal.  Pulmonary:     Effort: Pulmonary effort is normal. No respiratory distress.     Breath sounds: Normal breath sounds and air entry.  Musculoskeletal:     Cervical back: Normal range of motion and neck supple. No tenderness.  Lymphadenopathy:     Cervical: No cervical adenopathy.  Skin:    General: Skin is warm and dry.     Capillary Refill: Capillary refill takes less than 2 seconds.     Findings: No rash.  Neurological:     General: No focal deficit present.     Mental Status: She is alert and oriented to person, place, and time. Mental status is at baseline.     Cranial Nerves: No dysarthria or facial asymmetry.  Psychiatric:        Mood and Affect: Mood normal.        Speech: Speech normal.        Behavior: Behavior normal.        Thought Content: Thought content normal.        Judgment: Judgment normal.      UC Treatments / Results  Labs (all labs ordered are listed, but only abnormal results are displayed) Labs Reviewed - No data to display  EKG   Radiology No results  found.  Procedures Procedures (including critical care time)  Medications Ordered in UC Medications  dexamethasone (DECADRON) injection 10 mg (10 mg Intramuscular Given 10/31/22 0840)    Initial Impression / Assessment and Plan / UC Course  I have reviewed the triage vital signs and the nursing notes.  Pertinent labs & imaging results that were available during my care of the patient were reviewed by me and considered in my medical decision making (see chart for details).   1.  Angioedema due to ACE inhibitor Presentation is consistent with ACE inhibitor induced angioedema.  Patient is not diabetic but is a prediabetic.  Stable HEENT exam.  Airway intact without signs of anaphylaxis.  Maintaining secretions without drooling.  Low suspicion for allergic reaction etiology of patient's angioedema.  Dexamethasone 10 mg IM given to reduce swelling and inflammation.  Decadron will continue to work in her body over the next 2 to 3 days.  No indication for oral steroid burst, however advised to return to urgent care if symptoms fail to improve with use of Decadron.  Patient held in clinic for 10 to 15 minutes after giving Decadron with slight improvement in symptoms prior to discharge from urgent care.  Advised to schedule an appointment with her primary care provider in the next 1 week to discuss further management of hypertension.  Offered prescription of hydrochlorothiazide without lisinopril component, patient is hesitant to start taking further blood pressure medication due to side effect from lisinopril.  Advised to discuss this further with her PCP.  Discussed physical exam and available lab work findings in clinic with patient.  Counseled patient regarding appropriate use of medications and potential side effects for all medications recommended or prescribed today. Discussed red flag signs and symptoms of worsening condition,when to call the PCP office, return to urgent care, and when to seek higher  level of care in the emergency department. Patient verbalizes understanding and agreement with plan. All questions answered. Patient discharged in stable condition.   Final Clinical Impressions(s) / UC Diagnoses   Final diagnoses:  Angioedema due to angiotensin converting enzyme inhibitor (ACE-I)     Discharge Instructions      I gave you steroid in the clinic to reduce swelling to your lips.  This medication will continue working in your body over the next 2-3 days. If you start having any feelings of throat closure, throat itching, or shortness of breath, please go to the nearest emergency room for further workup and evaluation.  Please schedule an appointment with your primary care provider to discuss blood pressure medication regimen.  Do not take any more of your lisinopril hydrochlorothiazide as I believe the lisinopril part of this medication is what caused your symptoms.   If you develop any new or worsening symptoms or do not improve in the next 2 to 3 days, please return.  If your symptoms are severe, please go to the emergency room.  Follow-up with your primary care provider for further evaluation and management of your symptoms as well as ongoing wellness visits.  I hope you feel better!     ED Prescriptions   None    PDMP not reviewed this encounter.   Reita May [place], [place] 10/31/22 [phone]

## 2022-10-31 NOTE — Discharge Instructions (Signed)
I gave you steroid in the clinic to reduce swelling to your lips.  This medication will continue working in your body over the next 2-3 days. If you start having any feelings of throat closure, throat itching, or shortness of breath, please go to the nearest emergency room for further workup and evaluation.  Please schedule an appointment with your primary care provider to discuss blood pressure medication regimen.  Do not take any more of your lisinopril hydrochlorothiazide as I believe the lisinopril part of this medication is what caused your symptoms.   If you develop any new or worsening symptoms or do not improve in the next 2 to 3 days, please return.  If your symptoms are severe, please go to the emergency room.  Follow-up with your primary care provider for further evaluation and management of your symptoms as well as ongoing wellness visits.  I hope you feel better!

## 2022-10-31 NOTE — ED Triage Notes (Signed)
Pt is here for upper lip swelling. Pt stated she woke up with her lip swollen. Denies taking any new medication.

## 2023-08-26 ENCOUNTER — Ambulatory Visit (HOSPITAL_COMMUNITY)
Admission: EM | Admit: 2023-08-26 | Discharge: 2023-08-26 | Disposition: A | Discharge status: Home / Self Care | Payer: Medicaid Other

## 2023-08-26 ENCOUNTER — Encounter (HOSPITAL_COMMUNITY): Payer: Self-pay

## 2023-08-26 DIAGNOSIS — H81399 Other peripheral vertigo, unspecified ear: Secondary | ICD-10-CM

## 2023-08-26 LAB — POCT FASTING CBG KUC MANUAL ENTRY: POCT Glucose (KUC): 118 mg/dL — AB (ref 70–99)

## 2023-08-26 MED ORDER — MECLIZINE HCL 25 MG PO TABS: 25.0000 mg | ORAL_TABLET | Freq: Three times a day (TID) | ORAL | 0 refills | Status: AC | PRN

## 2023-08-26 NOTE — ED Provider Notes (Signed)
 [facility]    CSN: [id number] Arrival date & time: 08/26/23  1005      History   Chief Complaint Chief Complaint  Patient presents with   Dizziness   Nausea    HPI Carla [name] is a 58 y.o. female.   Subjective:  Carla [name] is a 58 y.o. female who is here for evaluation of dizziness. The dizziness started acutely 1 day ago.  Patient reports that shortly after getting out of the bed and walking down the stairs, she started to feel dizzy.  The dizziness felt like everything around her was spinning.  She also had some associated nausea.  Symptoms lasted just a few minutes and resolved after sitting down/resting.  Patient reports that the dizziness has been occurring intermittently ever since its onset.  There is none at rest.  Symptoms occur when getting up/walking around and better when sitting down.  She has intermittent nausea as well with no vomiting.  She also developed a mild headache last night which resolved after taking Tylenol.  She denies any tinnitus, chest pain, shortness of breath, palpitations, facial/limb paresthesias, vision problems or syncope.  No recent illness or recent travel.  Patient reports that her appetite has decreased over the past couple of days but she is able to eat and drink without difficulty.  She is drinking enough fluids to keep her urine clear.  She takes daily iron supplement for iron deficiency anemia.  Patient has a history of hypertension and was on lisinopril/hydrochlorothiazide up until April 2024 when she developed angioedema of her lips that was thought to be ACE induced.  Follow-up with her PCP in June 2024 showed stable/controlled blood pressure.  PCP decided not to restart any antihypertensive.  Patient reports that she monitors her blood pressure frequently but not daily.           Past Medical History:  Diagnosis Date   Anemia    Hypertension    Prediabetes 12/12/2018   Prediabetes     Patient Active Problem List    Diagnosis Date Noted   Prediabetes 12/12/2018   Essential hypertension, benign 08/28/2013    Past Surgical History:  Procedure Laterality Date   CESAREAN SECTION     CHOLECYSTECTOMY  1998   UMBILICAL HERNIA REPAIR  1999    OB History     Gravida  3   Para  3   Term  3   Preterm  0   AB  0   Living  3      SAB  0   IAB  0   Ectopic  0   Multiple  0   Live Births  3            Home Medications    Prior to Admission medications   Medication Sig Start Date End Date Taking? Authorizing Provider  ferrous sulfate 325 (65 FE) MG tablet Take 325 mg by mouth daily with breakfast.   Yes [provider]  meclizine (ANTIVERT) 25 MG tablet Take 1 tablet (25 mg total) by mouth 3 (three) times daily as needed for dizziness. 08/26/23  Yes Lurline Idol, FNP    Family History History reviewed. No pertinent family history.  Social History Social History   Tobacco Use   Smoking status: Never   Smokeless tobacco: Never  Vaping Use   Vaping status: Never Used  Substance Use Topics   Alcohol use: No   Drug use: No     Allergies  Lisinopril-hydrochlorothiazide   Review of Systems Review of Systems  Constitutional:  Positive for appetite change. Negative for activity change, chills, diaphoresis and fever.  HENT:  Negative for congestion, ear pain and tinnitus.   Respiratory:  Negative for chest tightness and shortness of breath.   Cardiovascular:  Negative for chest pain, palpitations and leg swelling.  Gastrointestinal:  Positive for nausea. Negative for vomiting.  Genitourinary:  Negative for difficulty urinating and dysuria.  Musculoskeletal:  Negative for gait problem.  Skin:  Negative for rash.  Neurological:  Positive for dizziness and headaches. Negative for tremors, seizures, syncope, facial asymmetry, speech difficulty, weakness and numbness.  All other systems reviewed and are negative.    Physical Exam Triage Vital Signs ED  Triage Vitals [08/26/23 1028]  Encounter Vitals Group     BP (!) 146/94     Systolic BP Percentile      Diastolic BP Percentile      Pulse Rate 60     Resp 18     Temp 97.6 F (36.4 C)     Temp Source Oral     SpO2 95 %     Weight      Height      Head Circumference      Peak Flow      Pain Score 0     Pain Loc      Pain Education      Exclude from Growth Chart    Orthostatic VS for the past 24 hrs:  BP- Lying Pulse- Lying BP- Sitting Pulse- Sitting BP- Standing at 0 minutes Pulse- Standing at 0 minutes  08/26/23 1233 132/73 60 137/85 60 131/85 60    Updated Vital Signs BP 129/81 (BP Location: Left Arm)   Pulse 60   Temp 97.6 F (36.4 C) (Oral)   Resp 18   LMP 08/03/2023   SpO2 95%   Visual Acuity Right Eye Distance:   Left Eye Distance:   Bilateral Distance:    Right Eye Near:   Left Eye Near:    Bilateral Near:     Physical Exam Vitals and nursing note reviewed.  Constitutional:      General: She is not in acute distress.    Appearance: Normal appearance. She is not ill-appearing, toxic-appearing or diaphoretic.  HENT:     Head: Normocephalic.     Right Ear: Tympanic membrane, ear canal and external ear normal.     Left Ear: Tympanic membrane, ear canal and external ear normal.     Nose: Nose normal.     Mouth/Throat:     Mouth: Mucous membranes are moist.  Eyes:     General: Lids are normal. No visual field deficit.    Extraocular Movements: Extraocular movements intact.     Right eye: Normal extraocular motion and no nystagmus.     Left eye: Normal extraocular motion and no nystagmus.     Conjunctiva/sclera: Conjunctivae normal.     Visual Fields: Right eye visual fields normal and left eye visual fields normal.  Cardiovascular:     Rate and Rhythm: Normal rate and regular rhythm.     Pulses: Normal pulses.     Heart sounds: Normal heart sounds.  Pulmonary:     Effort: Pulmonary effort is normal.     Breath sounds: Normal breath sounds.   Abdominal:     Palpations: Abdomen is soft.  Musculoskeletal:        General: Normal range of motion.     Cervical  back: Normal range of motion and neck supple.  Skin:    General: Skin is warm and dry.  Neurological:     General: No focal deficit present.     Mental Status: She is alert and oriented to person, place, and time.     GCS: GCS eye subscore is 4. GCS verbal subscore is 5. GCS motor subscore is 6.     Cranial Nerves: Cranial nerves 2-12 are intact.     Sensory: Sensation is intact.     Motor: Motor function is intact.     Coordination: Coordination is intact.     Gait: Gait is intact.  Psychiatric:        Mood and Affect: Mood normal.        Behavior: Behavior normal.      UC Treatments / Results  Labs (all labs ordered are listed, but only abnormal results are displayed) Labs Reviewed  POCT FASTING CBG [facility] MANUAL ENTRY - Abnormal; Notable for the following components:      Result Value   POCT Glucose ([facility]) 118 (*)    All other components within normal limits    EKG   Radiology No results found.  Procedures Procedures (including critical care time)  Medications Ordered in UC Medications - No data to display  Initial Impression / Assessment and Plan / UC Course  I have reviewed the triage vital signs and the nursing notes.  Pertinent labs & imaging results that were available during my care of the patient were reviewed by me and considered in my medical decision making (see chart for details).    58 year old female presenting with acute dizziness.  Patient is afebrile.  Nontoxic.  Vital signs stable.  Glucose normal.  Orthostatic blood pressure with heart rate unremarkable.  Physical exam as above.  No focal deficits noted.  No red flag signs and symptoms noted.  No indication for imaging or emergent ED evaluation.  Patient symptoms likely due to peripheral vertigo. The nature of vertigo syndromes were discussed along with their usual course and  treatment.  Meclizine per medication orders. Educational materials given and questions answered.  PCP follow-up advised.  ED precautions reviewed.  Today's evaluation has revealed no signs of a dangerous process. Discussed diagnosis with patient and/or guardian. Patient and/or guardian aware of their diagnosis, possible red flag symptoms to watch out for and need for close follow up. Patient and/or guardian understands verbal and written discharge instructions. Patient and/or guardian comfortable with plan and disposition.  Patient and/or guardian has a clear mental status at this time, good insight into illness (after discussion and teaching) and has clear judgment to make decisions regarding their care  Documentation was completed with the aid of voice recognition software. Transcription may contain typographical errors. Final Clinical Impressions(s) / UC Diagnoses   Final diagnoses:  Peripheral vertigo, unspecified laterality     Discharge Instructions      Your symptoms is likely due to a condition called vertigo.   Vertigo is the feeling that you or the things around you are moving or spinning when they're not. It's different than feeling dizzy. It can also cause: Loss of balance. Trouble standing or walking. Nausea and vomiting.  This feeling can come and go at any time. It can last from a few seconds to minutes or even hours. It may go away on its own or be treated with medicine.  The medicine that I have prescribed for you is called MECLIZINE. You should take this  up to three times a day as needed.   Drink plenty of fluids to stay hydrated.  When you get up in the morning, first sit up on the side of the bed. When you feel okay, stand slowly while holding onto something. Move slowly. Avoid sudden body or head movements  Follow-up with your primary care provider in about a week. Call on Monday to make appointment.   Go to the ED immediately if:  You're always dizzy or you  faint. You have a stiff neck. You have trouble moving or speaking. Your hands, arms, or legs feel weak. Your hearing or eyesight changes       ED Prescriptions     Medication Sig Dispense Auth. Provider   meclizine (ANTIVERT) 25 MG tablet Take 1 tablet (25 mg total) by mouth 3 (three) times daily as needed for dizziness. 30 tablet Lurline Idol, FNP      PDMP not reviewed this encounter.   Lurline Idol, [place] 08/26/23 1254

## 2023-08-26 NOTE — Discharge Instructions (Addendum)
 Your symptoms is likely due to a condition called vertigo.   Vertigo is the feeling that you or the things around you are moving or spinning when they're not. It's different than feeling dizzy. It can also cause: Loss of balance. Trouble standing or walking. Nausea and vomiting.  This feeling can come and go at any time. It can last from a few seconds to minutes or even hours. It may go away on its own or be treated with medicine.  The medicine that I have prescribed for you is called MECLIZINE. You should take this up to three times a day as needed.   Drink plenty of fluids to stay hydrated.  When you get up in the morning, first sit up on the side of the bed. When you feel okay, stand slowly while holding onto something. Move slowly. Avoid sudden body or head movements  Follow-up with your primary care provider in about a week. Call on Monday to make appointment.   Go to the ED immediately if:  You're always dizzy or you faint. You have a stiff neck. You have trouble moving or speaking. Your hands, arms, or legs feel weak. Your hearing or eyesight changes

## 2023-08-26 NOTE — ED Triage Notes (Signed)
 Pt c/o of positional positional dizziness, headache, body aches, nausea, loss of appetite, fatigue, and cough.  Start Date: 08/25/2023  Home Interventions: Tylenol (Last Dose last night)
